# Patient Record
Sex: Male | Born: 1950 | Race: Black or African American | Hispanic: No | Marital: Married | Smoking: Never smoker
Health system: Southern US, Community
[De-identification: ages and names within clinical notes are randomized; demographics above are authoritative.]

## PROBLEM LIST (undated history)

## (undated) DIAGNOSIS — I10 Essential (primary) hypertension: Secondary | ICD-10-CM

## (undated) HISTORY — DX: Essential (primary) hypertension: I10

---

## 2003-07-25 ENCOUNTER — Emergency Department (HOSPITAL_COMMUNITY): Admission: EM | Admit: 2003-07-25 | Discharge: 2003-07-25 | Payer: Self-pay | Admitting: Emergency Medicine

## 2003-09-10 HISTORY — PX: COLONOSCOPY: SHX174

## 2007-05-04 ENCOUNTER — Encounter: Admission: RE | Admit: 2007-05-04 | Discharge: 2007-05-04 | Payer: Self-pay | Admitting: Internal Medicine

## 2007-10-23 ENCOUNTER — Ambulatory Visit (HOSPITAL_BASED_OUTPATIENT_CLINIC_OR_DEPARTMENT_OTHER): Admission: RE | Admit: 2007-10-23 | Discharge: 2007-10-23 | Payer: Self-pay | Admitting: Urology

## 2008-01-22 ENCOUNTER — Ambulatory Visit (HOSPITAL_BASED_OUTPATIENT_CLINIC_OR_DEPARTMENT_OTHER): Admission: RE | Admit: 2008-01-22 | Discharge: 2008-01-22 | Payer: Self-pay | Admitting: Urology

## 2009-04-24 ENCOUNTER — Ambulatory Visit (HOSPITAL_BASED_OUTPATIENT_CLINIC_OR_DEPARTMENT_OTHER): Admission: RE | Admit: 2009-04-24 | Discharge: 2009-04-24 | Payer: Self-pay | Admitting: Orthopedic Surgery

## 2009-05-05 IMAGING — US US SCROTUM
1 series · 14 of 25 positions shown · non-contrast
Comparison: None.

SCROTAL ULTRASOUND WITH DOPPLER EVALUATION OF THE TESTICLES:

CLINICAL DATA: Hydrocele. Pain and scrotal area.
TECHNIQUE: Complete ultrasound examination of the testicles, epididymis, and
other scrotal structures was performed.  Color and duplex Doppler ultrasound was
utilized to evaluate blood flow to the testicles.

[Series 1: us scrotum · 0.10mm/px · 14 of 78 slices shown]
[im 1/78]
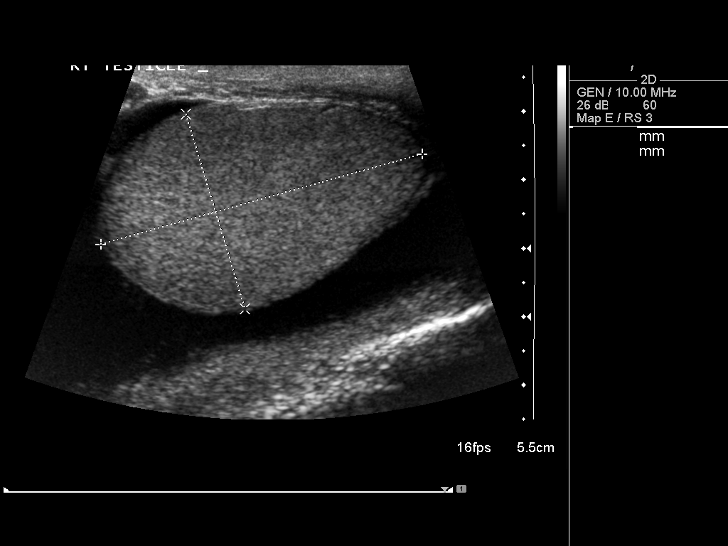
[im 7/78]
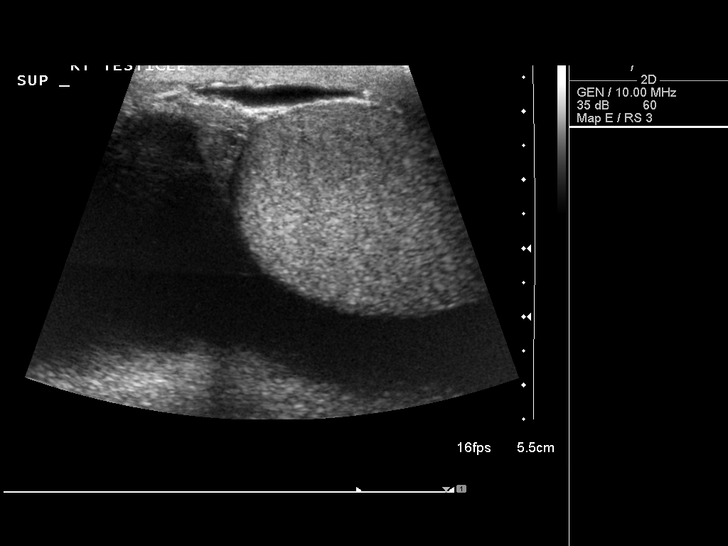
[im 13/78]
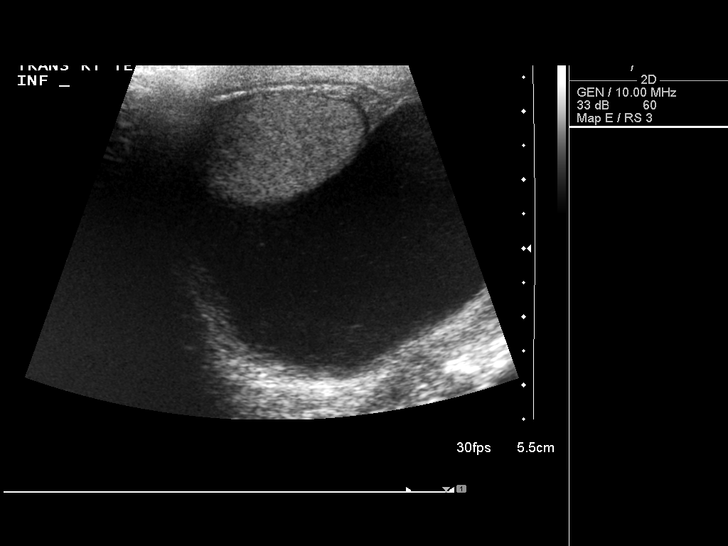
[im 20/78]
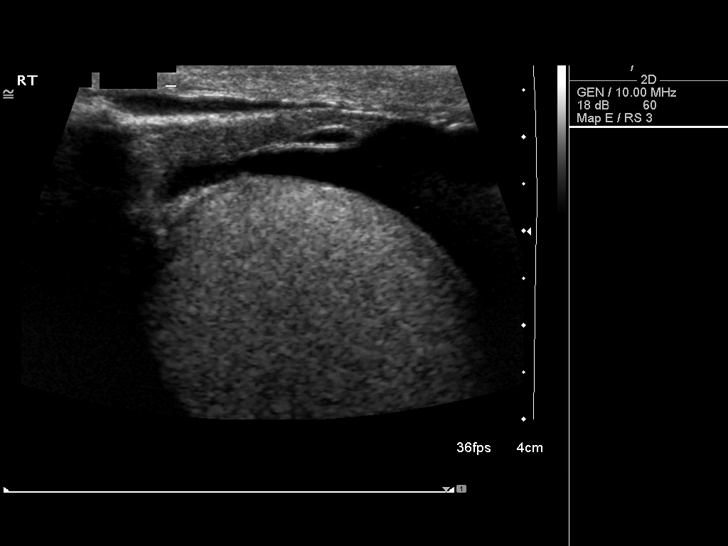
[im 26/78]
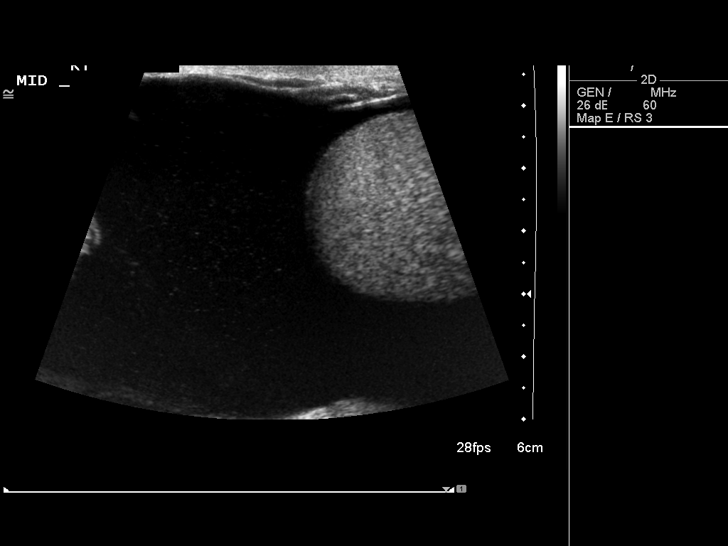
[im 29/78]
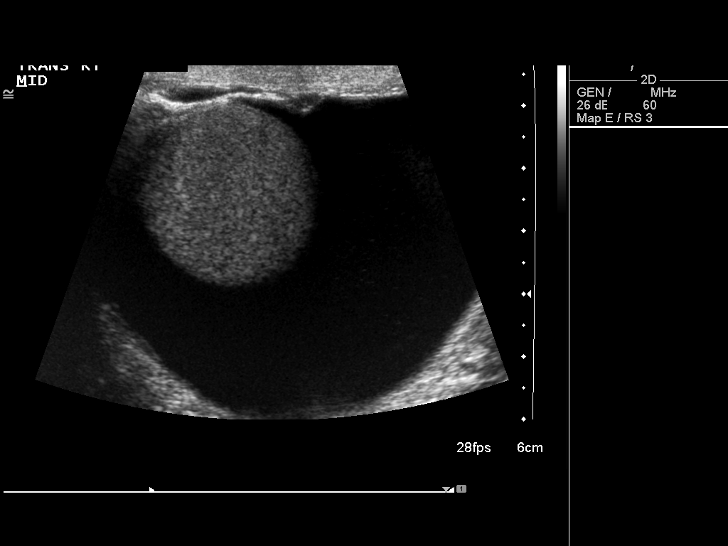
[im 36/78]
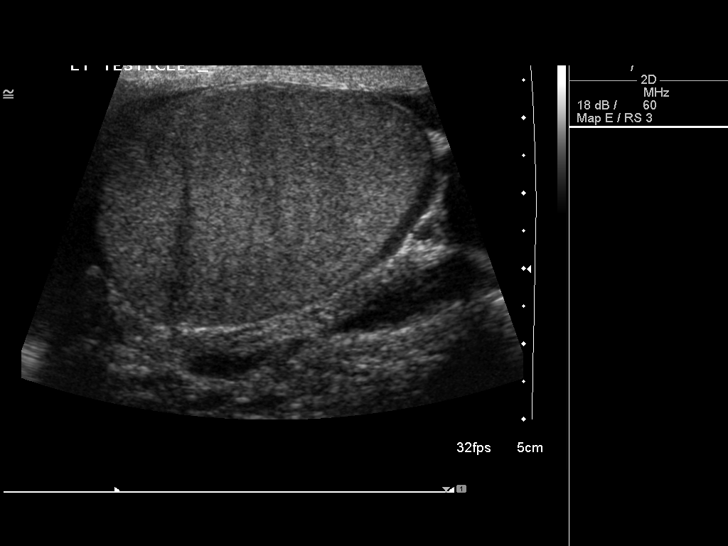
[im 42/78]
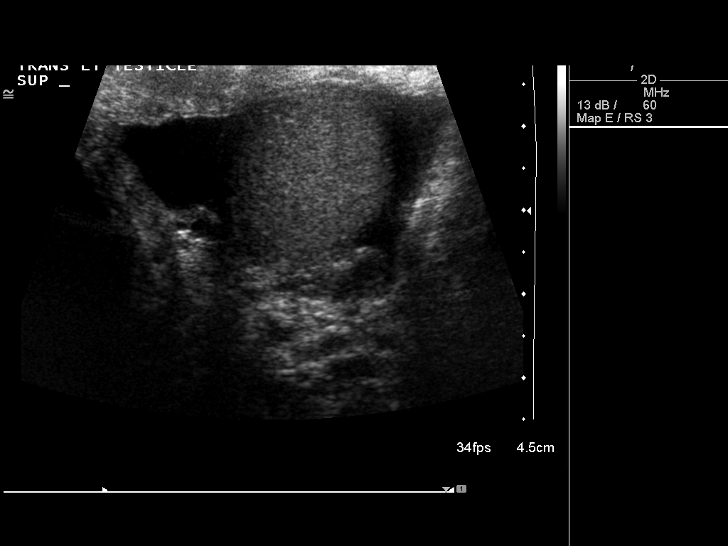
[im 49/78]
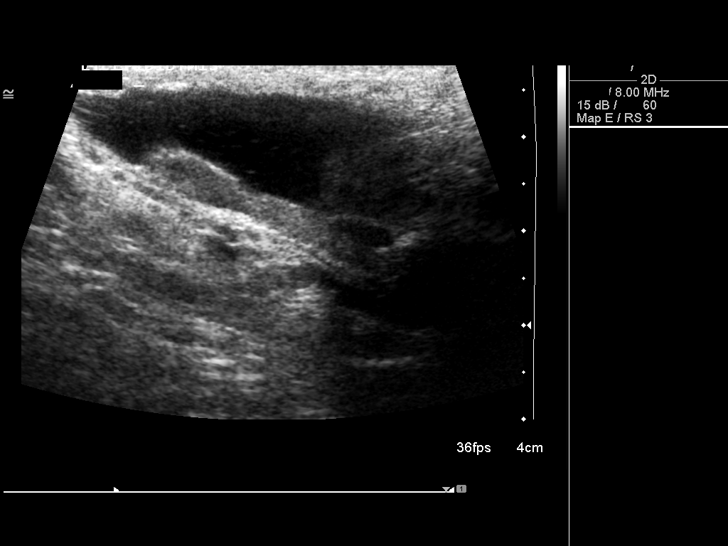
[im 52/78]
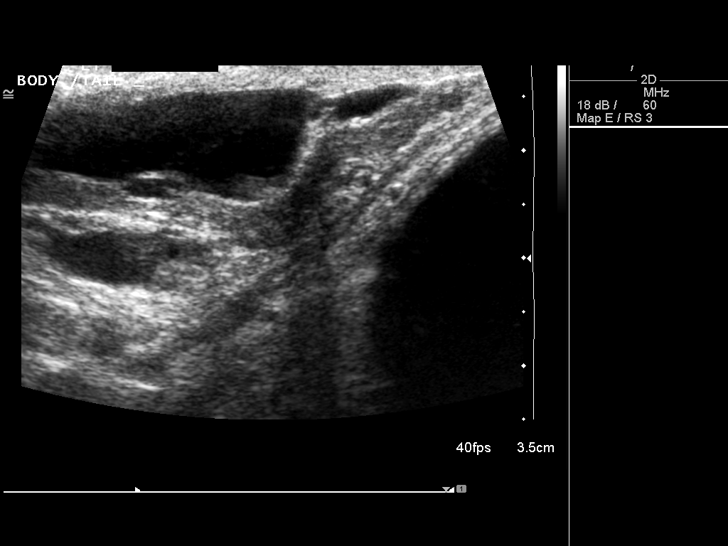
[im 58/78]
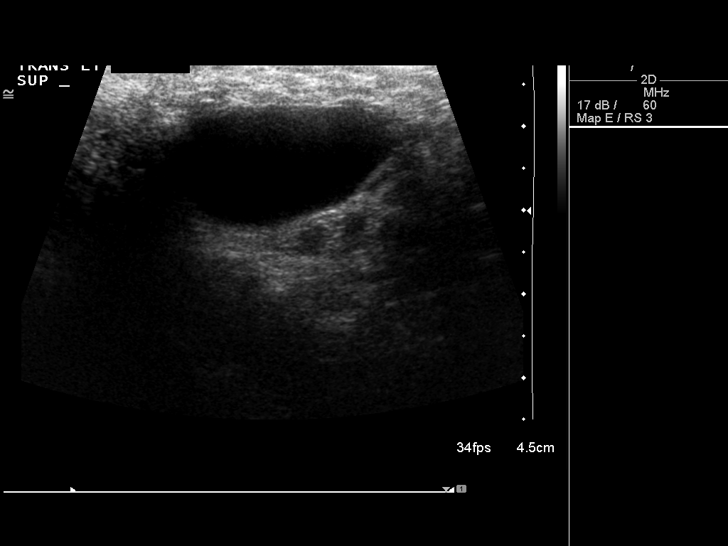
[im 65/78]
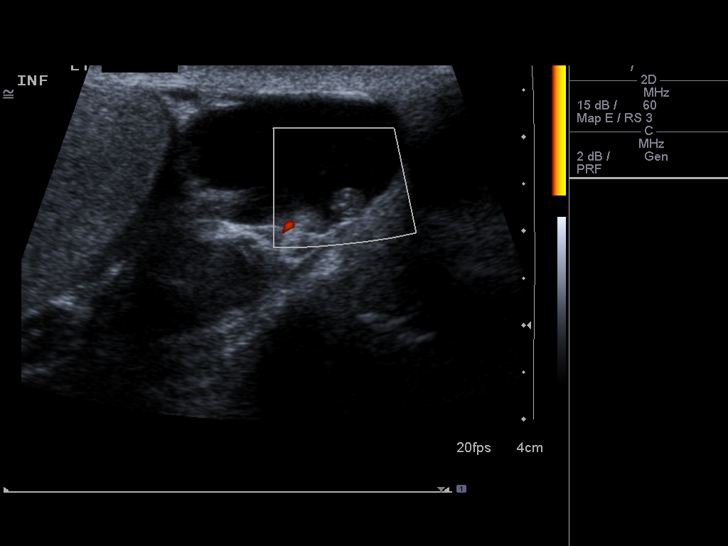
[im 71/78]
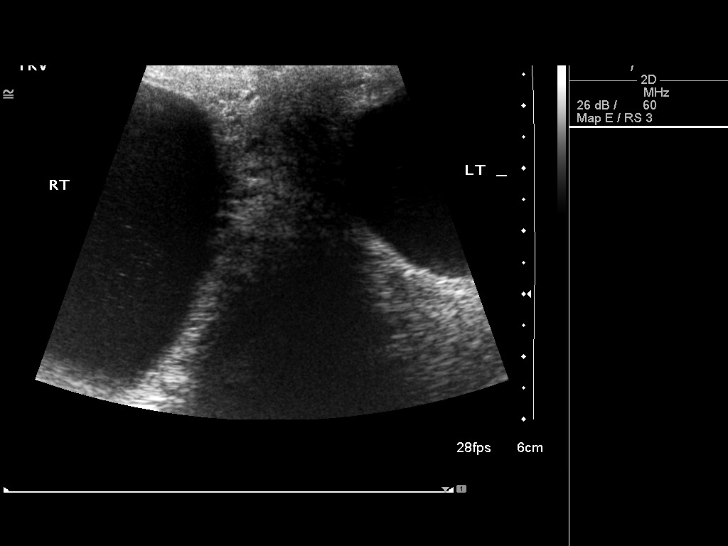
[im 78/78]
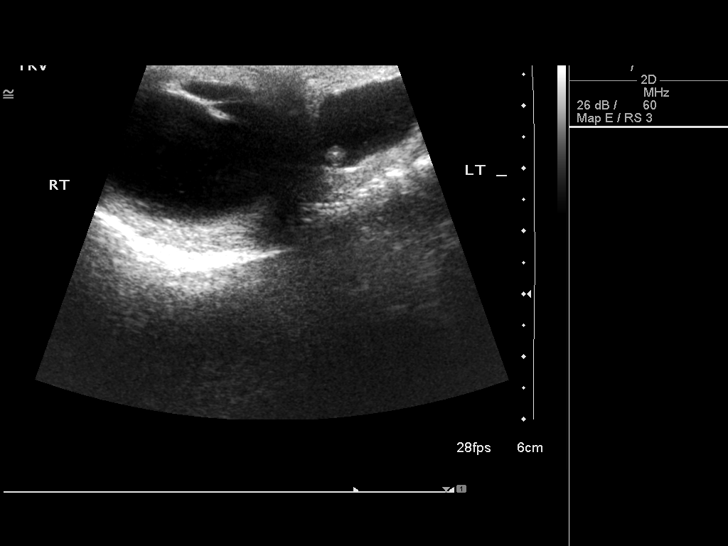

[14 of 25 positions shown; findings below may reference images not displayed]

FINDINGS: The right testicle measures 4.9 x 3.0 x 3.5 cm. The left testicle
measures 4.6 x 3.2 x 3.1 cm. Testicular echotexture is homogeneous. Blood flow
to the testes is symmetric. Arterial and venous waveforms are easily obtainable
within the parenchyma of each testicle.

The epididymal tissues are normal bilaterally. The patient has bilateral
hydroceles, right greater than left. Both hydroceles are complex with the left
showing more internal septations and debris.
IMPRESSION: Bilateral moderate to large hydroceles demonstrating complexity bilaterally. The
left hydrocele demonstrates more internal thickened septation than the right
although there is layering, mobile dependent debris bilaterally.

No intratesticular abnormality. Testicular blood flow is symmetric.

## 2009-05-05 IMAGING — CR DG LUMBAR SPINE COMPLETE 4+V
5 series · 5 of 5 positions shown · non-contrast
Comparison: None.

CLINICAL DATA: Right lower back pain.
 LUMBAR SPINE - 4 VIEW:

[view not recorded (1 of 5)]
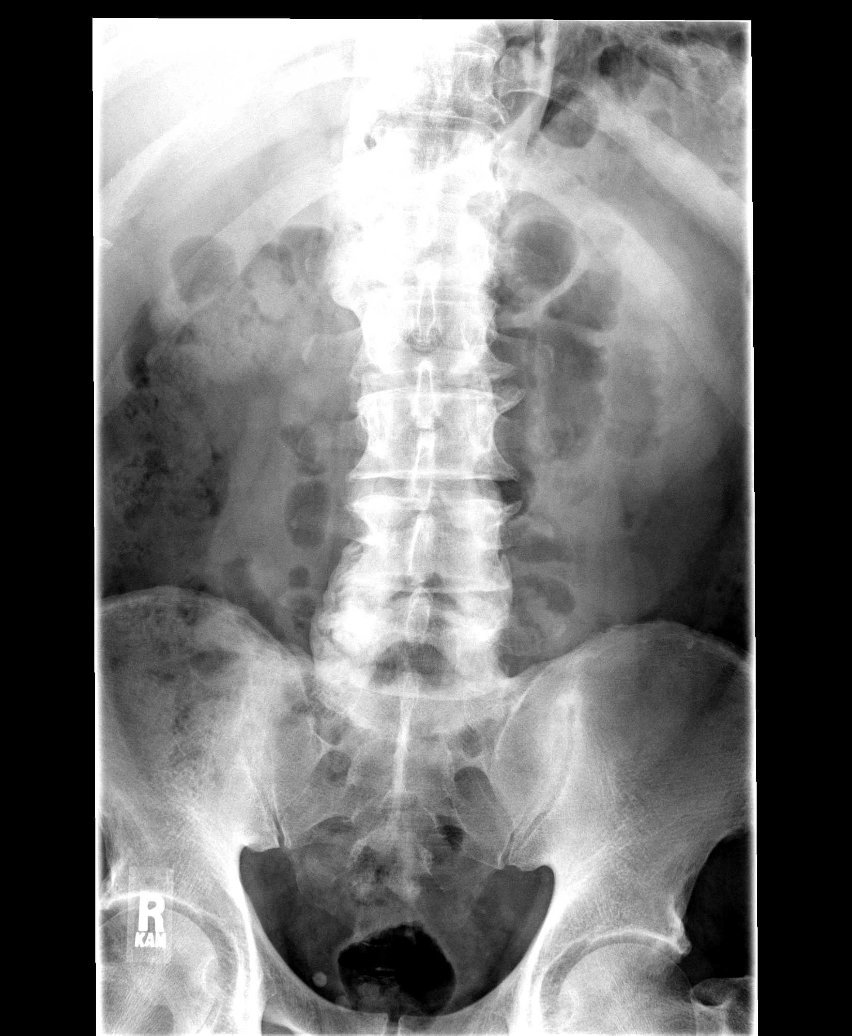

[view not recorded (2 of 5)]
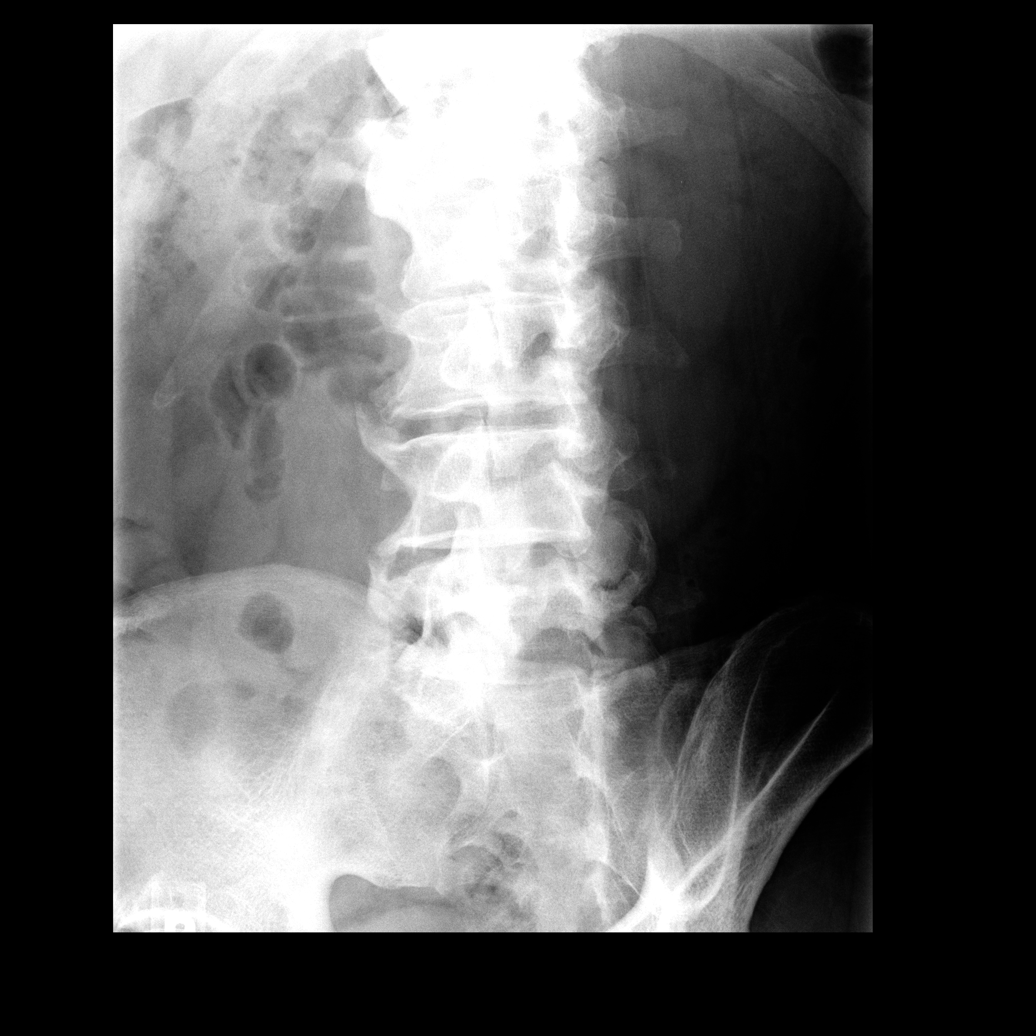

[view not recorded (3 of 5)]
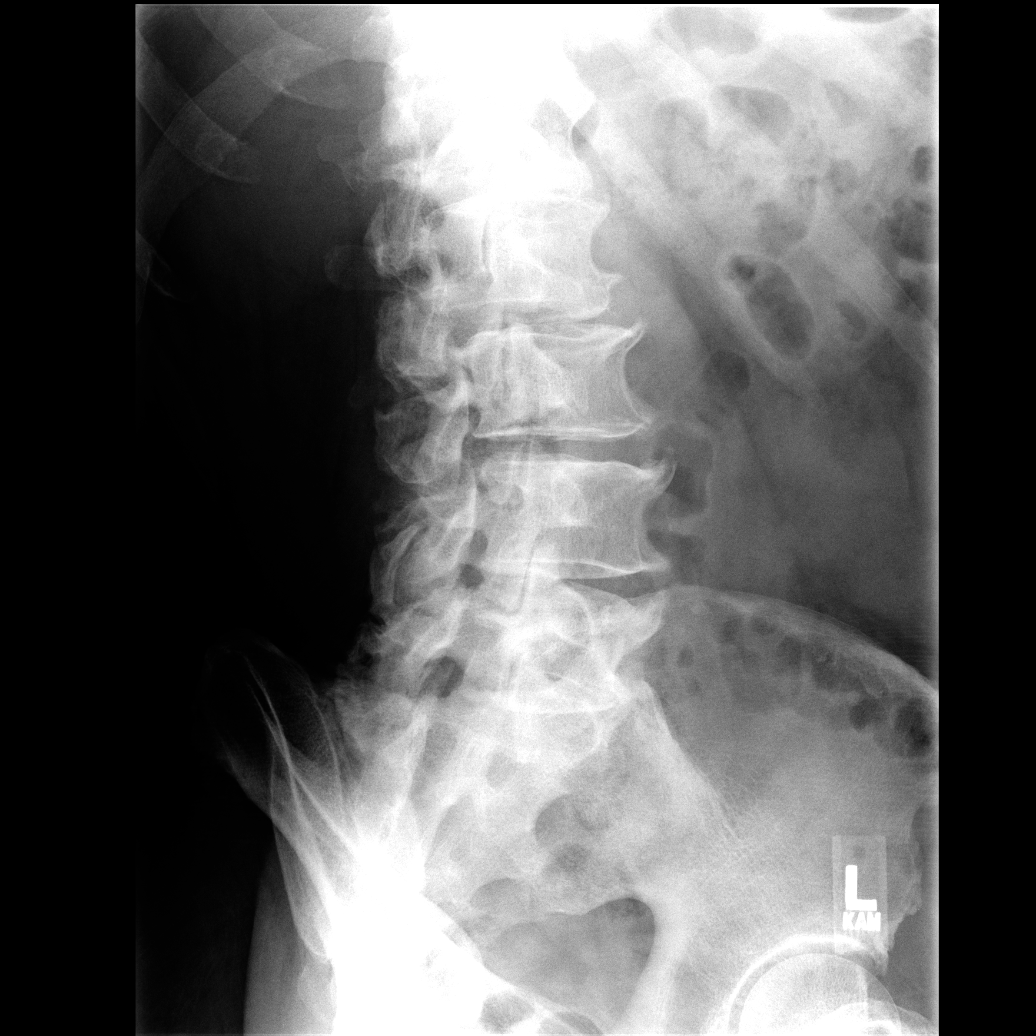

[view not recorded (4 of 5)]
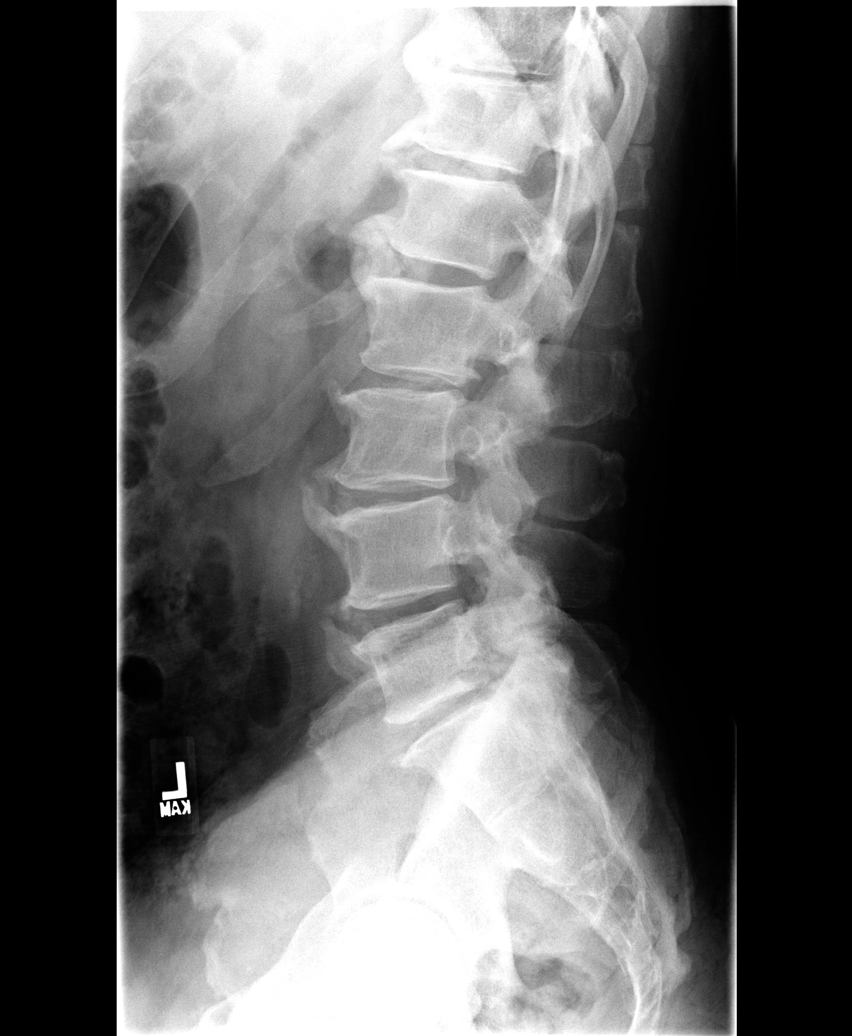

[view not recorded (5 of 5)]
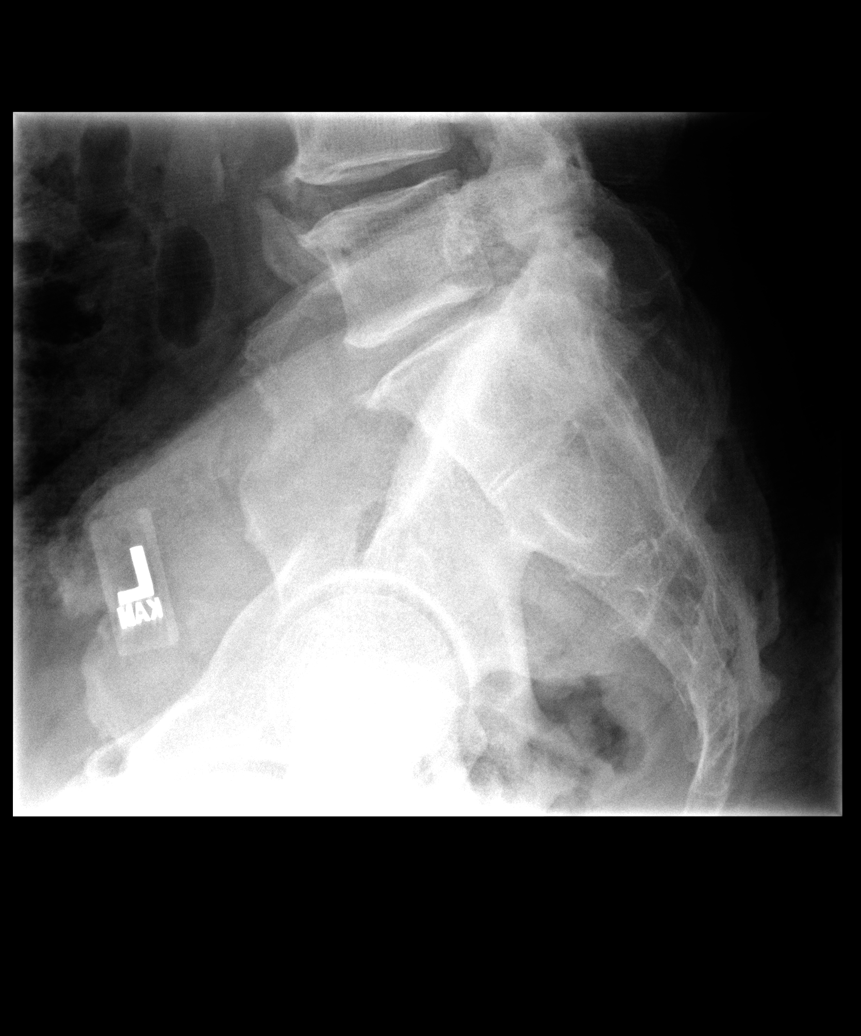

[5 of 5 positions shown; findings below may reference images not displayed]

FINDINGS: Alignment is anatomic.  Vertebral body height is maintained.  There are endplate degenerative changes, marginal osteophytosis, and facet hypertrophy throughout the visualized lumbar spine.  Disk space height is relatively well-maintained.
IMPRESSION: Spondylosis without subluxation or fracture.

## 2010-12-13 ENCOUNTER — Encounter: Payer: Self-pay | Admitting: Gastroenterology

## 2010-12-15 LAB — POCT HEMOGLOBIN-HEMACUE: Hemoglobin: 17 g/dL (ref 13.0–17.0)

## 2011-01-22 NOTE — Op Note (Signed)
NAMEHONG, MORING             ACCOUNT NO.:  [id number]   MEDICAL RECORD NO.:  [id number]          PATIENT TYPE:  AMB   LOCATION:  [facility]                          FACILITY:  [facility]   PHYSICIAN:  Feliberto Gottron. Turner Daniels, M.D.   DATE OF BIRTH:  16-May-1951   DATE OF PROCEDURE:  04/24/2009  DATE OF DISCHARGE:                               OPERATIVE REPORT   PREOPERATIVE DIAGNOSIS:  Symptomatic right elbow olecranon spur with  fracture.   POSTOPERATIVE DIAGNOSIS:  Symptomatic right elbow olecranon spur with  fracture.   PROCEDURE:  Excision of right olecranon spur with fracture.   SURGEON:  Feliberto Gottron. Turner Daniels, MD   FIRST ASSISTANT:  Shirl Harris, PA   ANESTHETIC:  General LMA.   ESTIMATED BLOOD LOSS:  Minimal.   FLUID REPLACEMENT:  800 mL of crystalloid.   DRAINS PLACED:  None.   TOURNIQUET TIME:  None.   INDICATIONS FOR PROCEDURE:  A 60 year old man who has had olecranon spur  removed in the past around 10 or 15 years ago and came into my office a  few weeks ago with a large symptomatic recurrent exostosis off of his  right olecranon with a fracture through the exostosis.  He desires  elective removal.  Risks and benefits of surgery have been discussed.  Questions answered.   DESCRIPTION OF PROCEDURE:  The patient was identified by armband and  taken to the operating room at [facility].  The appropriate  anesthetic monitors were attached and general LMA anesthesia was  induced.  Tourniquet was applied high to the right upper extremity, but  never used.  The right upper extremity was prepped and draped in usual  sterile fashion from the wrist to the mid arm.  Time-out procedure was  then performed.  Then, we began the operation itself by making a  posterior midline incision utilizing the old incision over the olecranon  about 6 or 7 cm in length through the skin and subcutaneous tissue down  to the level of periosteum over the spur.  The spur was then outlined.  It  was quite large, almost 2 cm wide, 2 cm long, and about 8 mm in depth  at its longest.  The major portion of the spur was then removed with an  ACL power saw creating a flatter type 1 acromion and we probably ended  up sacrificing about 10-20% of the triceps insertion, but it was already  compromised and filled with ossicles anyway.  Satisfied with removal of  spur.  We then smoothed off the edges with the ACL saw in a power rasp  mode debrided small ossicles in the triceps tendon, irrigated out with  normal saline solution.  I then closed the  bursa with running 3-0 Vicryl suture, the subcutaneous tissue with 3-0  Vicryl suture, and the skin with 3-0 Vicryl subcuticular suture.  Dressing of Xeroform, 4x4 dressing, sponges, Webril, and an Ace wrap  applied.  The patient was then placed in a sling, awakened, and taken to  the recovery room without difficulty.      Feliberto Gottron. Turner Daniels, M.D.  Electronically  Signed     FJR/MEDQ  D:  04/24/2009  T:  04/25/2009  Job:  [id number]

## 2011-01-22 NOTE — Op Note (Signed)
NAMEARTHOR, GORTER             ACCOUNT NO.:  [id number]   MEDICAL RECORD NO.:  [id number]         PATIENT TYPE:  HAMB   LOCATION:                               FACILITY:  [facility]   PHYSICIAN:  Mark C. Vernie Ammons, M.D.  DATE OF BIRTH:  December 15, 1950   DATE OF PROCEDURE:  01/22/2008  DATE OF DISCHARGE:                               OPERATIVE REPORT   PREOPERATIVE DIAGNOSIS:  Hematocele.   POSTOPERATIVE DIAGNOSIS:  Hematocele.   PROCEDURE PERFORMED:  Scrotal exploration, drainage of hematocele.   SURGEON:  Mark C. Vernie Ammons, M.D.   ASSISTANT:  Melina Schools   ANESTHESIA:  General.   DRAINS:  Half inch Penrose.   INDICATIONS FOR PROCEDURE:  Mr. Elk is a 60 year old male who  underwent bilateral hydrocele repair.  He was noted clinically at that  time to have worse than usual hemostasis.  He subsequently developed a  collection of blood within the tunica vaginalis of his right  hemiscrotum.  He presents for surgical drainage.   DESCRIPTION OF PROCEDURE:  The patient was brought to the operating  room.  He was identified by his armband.  Informed consent was verified  and preoperative time-out was performed.  After the successful induction  of general anesthesia, the patient was placed in the supine position.  The scrotum was shaved, prepped and draped in the usual fashion.  A 4-cm  incision was made in the mid to inferior portion of the median raphe.  Blunt and sharp dissection was used to carry this down to the level of  the tunica vaginalis.  The rind surrounding this was very tense.  He  also appeared to have multiple friable vessels for which gaining control  was difficult.  Because of his apparent propensity for bleeding and the  possibility that he may have bleeding diathesis, the decision was made  not to deliver the hematocele, but simply to drain and close it.  The  tunica vaginalis was entered sharply.  Approximately 100 mL of crank oil  appearing fluid drained.  There  was also old organized clot that was  evacuated.  We vigorously irrigated the right hemiscrotum.  We examined  the testis.  It had some adherent clot over it but appeared otherwise  intact.  We then brought a 1/2 inch Penrose drain out the inferior  dependent portion of the right hemiscrotum and sutured it in place with  a nylon.  We then closed the deep layer of tissue with a running  hemostatic locking 2-0 chromic.  The skin was then closed with a running  3-0 chromic.  Collodion was applied.  At this time the procedure was  terminated.  The patient tolerated the procedure well.   DISPOSITION:  To the postanesthesia care unit.  The patient will have a  blood draw to check his complete blood count, platelet count, as well as  PT and PTT to assess for possible bleeding diathesis.  He may require a  full hematologic evaluation.     ______________________________  Cristal Generous Vernie Ammons, M.D.  Electronically Signed  JR/MEDQ  D:  01/22/2008  T:  01/22/2008  Job:  [id number]

## 2011-01-22 NOTE — Op Note (Signed)
NAMEJERAULD, [name]             ACCOUNT NO.:  [id number]   MEDICAL RECORD NO.:  [id number]          PATIENT TYPE:  AMB   LOCATION:  [facility]                         FACILITY:  [facility]   PHYSICIAN:  Mark C. Vernie Ammons, M.D.  DATE OF BIRTH:  [birth year]   DATE OF PROCEDURE:  10/23/2007  DATE OF DISCHARGE:                               OPERATIVE REPORT   PREOPERATIVE DIAGNOSIS:  Bilateral hydroceles (right greater than left).   POSTOPERATIVE DIAGNOSIS:  Bilateral hydroceles (right greater than  left).   PROCEDURE:  Bilateral hydrocelectomy.   SURGEON:  Mark C. Vernie Ammons, M.D.   ANESTHESIA:  General.   SPECIMENS:  None.   BLOOD LOSS:  Minimal.   COMPLICATIONS:  None.   INDICATIONS:  The patient is a 60 year old male who had noted about a  month's worth of right hemiscrotal swelling when I first saw him in  September 2008.  He had undergone a previous ultrasound of this at  [facility] on May 04, 2007, and that study revealed bilateral  moderate-to-large hydroceles with complexity bilateral.  The left  hydrocele had more internal thickened septations then the right, there  was some question of layering of local mobile debris bilaterally.  The  patient does not have any discomfort, but the size of the hydrocele is  significant enough to warrant treatment, and the patient has been  informed of the risks, complications, and alternatives and has elected  to proceed with surgery.   DESCRIPTION OF OPERATION:  After informed consent, the patient was  brought to the major OR and placed on the table, administered general  anesthesia in a supine position.  His genitalia was then sterilely  prepped and draped and an official time out was performed.  A midline  median raphe scrotal incision was then made and carried down through the  scrotal tissue to expose the tunica of the right testicle with  associated but very large hydrocele.  This was cleared of overlying  tissue and incised in  the midline and clear amber fluid was drained.  Inspection of the internal surface revealed no abnormalities and the  testicle was noted to be entirely normal.  The appendix testis was  identified and fulgurated and the excess parietal tunica was then  excised.  The remainder of the parietal tunica was then reapproximated  posterior to the testicle and the cord with running 3-0 chromic suture  with care being taken not to place any undue tension on the cord at the  most proximal extent.  I then replaced the right testicle back into the  right hemiscrotum in its normal anatomic position and turned my  attention to the left hemiscrotum.   On the left side, an identical procedure was performed and on this side,  I found the hydrocele appeared somewhat more loculated. It was moderate  in size and this was drained and treated in an identical fashion.  The  testicle was also replaced in the left hemiscrotum in its normal  anatomic position and I then irrigated the wound and closed the deep  scrotal tissue with running 3-0 chromic suture. The skin  was then  reapproximated with running 3-0 chromic and fluffs, Kerlix and a scrotal  support were then applied.  The patient was awakened and taken to the  recovery room.  Of note, prior to completion of the scrotal skin  closure, I injected 0.25% Marcaine in the subcu tissue.   In the recovery room, ice was applied to the scrotum and the patient is  to be observed until fully recovered.  He will be given a prescription  for Tylox, number 36, and take doxycycline 100 mg b.i.d. for 3 days.  He  was given written instructions upon discharge and will follow up in my  office in approximately four weeks since and sooner as necessary.      Mark C. Vernie Ammons, M.D.  Electronically Signed     MCO/MEDQ  D:  10/23/2007  T:  10/24/2007  Job:  [id number]

## 2011-05-31 LAB — POCT HEMOGLOBIN-HEMACUE
Hemoglobin: 18.1 — ABNORMAL HIGH
Operator id: 268271

## 2011-06-05 LAB — CBC
HCT: 35 — ABNORMAL LOW
Hemoglobin: 11.9 — ABNORMAL LOW
MCHC: 34.1
MCV: 89.5
Platelets: 179
RBC: 3.9 — ABNORMAL LOW
RDW: 12.5
WBC: 4.7

## 2011-06-05 LAB — PROTIME-INR
INR: 1.1
Prothrombin Time: 14.5

## 2011-06-05 LAB — POCT HEMOGLOBIN-HEMACUE
Hemoglobin: 13.6
Operator id: 268271

## 2011-06-05 LAB — APTT: aPTT: 29

## 2011-10-05 ENCOUNTER — Ambulatory Visit (INDEPENDENT_AMBULATORY_CARE_PROVIDER_SITE_OTHER): Payer: BC Managed Care – PPO

## 2011-10-05 DIAGNOSIS — R071 Chest pain on breathing: Secondary | ICD-10-CM

## 2011-10-05 DIAGNOSIS — R05 Cough: Secondary | ICD-10-CM

## 2013-09-23 ENCOUNTER — Encounter: Payer: Self-pay | Admitting: Gastroenterology

## 2014-06-28 ENCOUNTER — Encounter: Payer: Self-pay | Admitting: Gastroenterology

## 2014-09-28 ENCOUNTER — Ambulatory Visit: Payer: Self-pay | Admitting: Neurology

## 2019-08-04 ENCOUNTER — Encounter: Payer: Self-pay | Admitting: Nurse Practitioner

## 2019-08-04 ENCOUNTER — Other Ambulatory Visit: Payer: Self-pay

## 2019-08-04 ENCOUNTER — Ambulatory Visit: Payer: 59 | Admitting: Nurse Practitioner

## 2019-08-04 VITALS — BP 182/86 | HR 68 | Temp 98.4°F | Ht 65.4 in | Wt 208.6 lb

## 2019-08-04 DIAGNOSIS — Z1211 Encounter for screening for malignant neoplasm of colon: Secondary | ICD-10-CM

## 2019-08-04 DIAGNOSIS — Z13228 Encounter for screening for other metabolic disorders: Secondary | ICD-10-CM

## 2019-08-04 DIAGNOSIS — R202 Paresthesia of skin: Secondary | ICD-10-CM

## 2019-08-04 DIAGNOSIS — Z1159 Encounter for screening for other viral diseases: Secondary | ICD-10-CM | POA: Diagnosis not present

## 2019-08-04 DIAGNOSIS — I10 Essential (primary) hypertension: Secondary | ICD-10-CM

## 2019-08-04 MED ORDER — LOSARTAN POTASSIUM 25 MG PO TABS: 25.0000 mg | ORAL_TABLET | Freq: Every day | ORAL | 2 refills | Status: DC

## 2019-08-04 NOTE — Progress Notes (Addendum)
Subjective:     Patient ID: [name] , male    DOB: [birth year] , 68 y.o.   MRN: [id number]   Chief Complaint  Patient presents with  . Establish Care    Tingling in finger tips at night (R) hand    HPI  Here to establish care - he has not seen a PCP in 3-4 years.  Work in Secretary/administrator.  Married.  2 children - healthy.  He thinks he was on blood pressure medications but was not regularly.  Alcohol - 1/3 of cup nightly.  Non-smoker.  No illicit drugs.    PMH - hypertension.    [facility] - father - dementia.  Siblings - healthy.  Tingling to right fingertips, for 3 weeks.  Right handed.  Worse at night when laying down. When he is up walking around.  Denies any problems holding objects.  No injuries in the past.      Past Medical History:  Diagnosis Date  . Hypertension      Family History  Problem Relation Age of Onset  . Dementia Father      Current Outpatient Medications:  .  losartan (COZAAR) 25 MG tablet, Take 1 tablet (25 mg total) by mouth daily., Disp: 30 tablet, Rfl: 2   No Known Allergies   Review of Systems  Constitutional: Negative.   Respiratory: Negative.   Cardiovascular: Negative.  Negative for chest pain, palpitations and leg swelling.  Musculoskeletal:       Tingling first second and third fingertips.  Neurological: Negative for dizziness and headaches.     Today's Vitals   08/04/19 0918  BP: (!) 182/86  Pulse: 68  Temp: 98.4 F (36.9 C)  TempSrc: Oral  Weight: 208 lb 9.6 oz (94.6 kg)  Height: 5' 5.4" (1.661 m)   Body mass index is 34.29 kg/m.   Objective:  Physical Exam Vitals signs reviewed.  Constitutional:      General: He is not in acute distress.    Appearance: Normal appearance. He is obese.  Cardiovascular:     Rate and Rhythm: Normal rate and regular rhythm.     Pulses: Normal pulses.     Heart sounds: Normal heart sounds. No murmur.  Pulmonary:     Effort: Pulmonary effort is normal. No respiratory distress.   Breath sounds: Normal breath sounds.  Musculoskeletal:        General: No swelling.  Skin:    General: Skin is warm and dry.     Capillary Refill: Capillary refill takes less than 2 seconds.  Neurological:     General: No focal deficit present.     Mental Status: He is alert and oriented to person, place, and time.  Psychiatric:        Mood and Affect: Mood normal.        Behavior: Behavior normal.        Thought Content: Thought content normal.        Judgment: Judgment normal.         Assessment And Plan:     1. Essential hypertension  Chronic, he has not been on any medications for several years.  Elevated today, will start him on losartan  Encouraged to avoid high salt foods - CMP14+EGFR - EKG 12-Lead - losartan (COZAAR) 25 MG tablet; Take 1 tablet (25 mg total) by mouth daily.  Dispense: 30 tablet; Refill: 2  2. Encounter for screening for metabolic disorder  - Lipid Profile - Hemoglobin A1c  3.  Tingling sensation  Reports intermittent tingling sensation to his fingertips  Will check for metabolic causes.  EKG done with SR  Nonspecific T abnormality - Vitamin B12 - TSH  4. Encounter for hepatitis C screening test for low risk patient  Will check for Hepatitis C screening due to being born between the years 40-1966 - Hepatitis C antibody  5. Encounter for screening colonoscopy  According to USPTF Colorectal cancer Screening guidelines. Colonoscopy is recommended every 10 years, starting at age 16years.  Will refer to GI for colon cancer screening. - Ambulatory referral to Gastroenterology     [name], FNP    THE PATIENT IS ENCOURAGED TO PRACTICE SOCIAL DISTANCING DUE TO THE COVID-19 PANDEMIC.

## 2019-08-06 LAB — CMP14+EGFR
ALT: 27 IU/L (ref 0–44)
AST: 22 IU/L (ref 0–40)
Albumin/Globulin Ratio: 1.7 (ref 1.2–2.2)
Albumin: 4.2 g/dL (ref 3.8–4.8)
Alkaline Phosphatase: 79 IU/L (ref 39–117)
BUN/Creatinine Ratio: 14 (ref 10–24)
BUN: 14 mg/dL (ref 8–27)
Bilirubin Total: 0.5 mg/dL (ref 0.0–1.2)
CO2: 26 mmol/L (ref 20–29)
Calcium: 8.8 mg/dL (ref 8.6–10.2)
Chloride: 102 mmol/L (ref 96–106)
Creatinine, Ser: 1.01 mg/dL (ref 0.76–1.27)
GFR calc Af Amer: 87 mL/min/{1.73_m2} (ref >59)
GFR calc non Af Amer: 76 mL/min/{1.73_m2} (ref >59)
Globulin, Total: 2.5 g/dL (ref 1.5–4.5)
Glucose: 102 mg/dL — ABNORMAL HIGH (ref 65–99)
Potassium: 4.2 mmol/L (ref 3.5–5.2)
Sodium: 139 mmol/L (ref 134–144)
Total Protein: 6.7 g/dL (ref 6.0–8.5)

## 2019-08-06 LAB — VITAMIN B12: Vitamin B-12: 370 pg/mL (ref 232–1245)

## 2019-08-06 LAB — LIPID PANEL
Chol/HDL Ratio: 4.2 ratio (ref 0.0–5.0)
Cholesterol, Total: 206 mg/dL — ABNORMAL HIGH (ref 100–199)
HDL: 49 mg/dL (ref >39)
LDL Chol Calc (NIH): 136 mg/dL — ABNORMAL HIGH (ref 0–99)
Triglycerides: 117 mg/dL (ref 0–149)
VLDL Cholesterol Cal: 21 mg/dL (ref 5–40)

## 2019-08-06 LAB — HEMOGLOBIN A1C
Est. average glucose Bld gHb Est-mCnc: 91 mg/dL
Hgb A1c MFr Bld: 4.8 % (ref 4.8–5.6)

## 2019-08-06 LAB — TSH: TSH: 1.66 u[IU]/mL (ref 0.450–4.500)

## 2019-08-06 LAB — HEPATITIS C ANTIBODY: Hep C Virus Ab: <0.1 s/co ratio (ref 0.0–0.9)

## 2019-08-09 ENCOUNTER — Encounter: Payer: Self-pay | Admitting: Nurse Practitioner

## 2019-09-01 ENCOUNTER — Encounter: Payer: Self-pay | Admitting: Gastroenterology

## 2019-09-07 ENCOUNTER — Ambulatory Visit: Payer: 59 | Admitting: Nurse Practitioner

## 2019-09-22 ENCOUNTER — Other Ambulatory Visit: Payer: Self-pay

## 2019-09-22 ENCOUNTER — Ambulatory Visit (AMBULATORY_SURGERY_CENTER): Payer: Self-pay

## 2019-09-22 VITALS — Temp 97.8°F | Ht 65.0 in | Wt 210.4 lb

## 2019-09-22 DIAGNOSIS — Z01818 Encounter for other preprocedural examination: Secondary | ICD-10-CM

## 2019-09-22 DIAGNOSIS — Z1211 Encounter for screening for malignant neoplasm of colon: Secondary | ICD-10-CM

## 2019-09-22 MED ORDER — CLENPIQ 10-3.5-12 MG-GM -GM/160ML PO SOLN: 1.0000 | Freq: Once | ORAL | 0 refills | Status: AC

## 2019-09-22 NOTE — Progress Notes (Signed)
No egg or soy allergy known to patient  No issues with past sedation with any surgeries  or procedures, no intubation problems  No diet pills per patient No home 02 use per patient  No blood thinners per patient  Pt denies issues with constipation  No A fib or A flutter   EMMI video sent to pt's e mail   clenpiq sample given. Lot [id number] EXP-11/2019  Due to the COVID-19 pandemic we are asking patients to follow these guidelines. Please only bring one care partner. Please be aware that your care partner may wait in the car in the parking lot or if they feel like they will be too hot to wait in the car, they may wait in the lobby on the 4th floor. All care partners are required to wear a mask the entire time (we do not have any that we can provide them), they need to practice social distancing, and we will do a Covid check for all patient's and care partners when you arrive. Also we will check their temperature and your temperature. If the care partner waits in their car they need to stay in the parking lot the entire time and we will call them on their cell phone when the patient is ready for discharge so they can bring the car to the front of the building. Also all patient's will need to wear a mask into building.

## 2019-10-01 ENCOUNTER — Ambulatory Visit (INDEPENDENT_AMBULATORY_CARE_PROVIDER_SITE_OTHER): Payer: Commercial Managed Care - PPO

## 2019-10-01 ENCOUNTER — Other Ambulatory Visit: Payer: Self-pay | Admitting: Gastroenterology

## 2019-10-01 DIAGNOSIS — Z1159 Encounter for screening for other viral diseases: Secondary | ICD-10-CM

## 2019-10-04 LAB — SARS CORONAVIRUS 2 (TAT 6-24 HRS): SARS Coronavirus 2: NEGATIVE

## 2019-10-06 ENCOUNTER — Other Ambulatory Visit: Payer: Self-pay

## 2019-10-06 ENCOUNTER — Ambulatory Visit (AMBULATORY_SURGERY_CENTER): Payer: Commercial Managed Care - PPO | Admitting: Gastroenterology

## 2019-10-06 ENCOUNTER — Encounter: Payer: Self-pay | Admitting: Gastroenterology

## 2019-10-06 VITALS — BP 180/104 | HR 67 | Temp 97.7°F | Resp 41 | Ht 65.0 in | Wt 210.4 lb

## 2019-10-06 DIAGNOSIS — K5730 Diverticulosis of large intestine without perforation or abscess without bleeding: Secondary | ICD-10-CM

## 2019-10-06 DIAGNOSIS — K635 Polyp of colon: Secondary | ICD-10-CM

## 2019-10-06 DIAGNOSIS — D125 Benign neoplasm of sigmoid colon: Secondary | ICD-10-CM

## 2019-10-06 DIAGNOSIS — Z1211 Encounter for screening for malignant neoplasm of colon: Secondary | ICD-10-CM | POA: Diagnosis not present

## 2019-10-06 HISTORY — PX: COLONOSCOPY: SHX174

## 2019-10-06 MED ORDER — SODIUM CHLORIDE 0.9 % IV SOLN: 500.0000 mL | Freq: Once | INTRAVENOUS | Status: DC

## 2019-10-06 NOTE — Op Note (Signed)
[place] Patient Name: [name] Procedure Date: 10/06/2019 11:51 AM MRN: [id number] Endoscopist: Gerrit Heck , MD Age: 69 Referring MD:  Date of Birth: Sep 24, 1950 Gender: Male Account #: [id number] Procedure:                Colonoscopy Indications:              Screening for colorectal malignant neoplasm (last                            colonoscopy was more than 10 years ago). LAst                            colonoscopy was in 2005, and no polyps noted. He is                            otherwise without any active GI symptoms and no                            knonw family history of colon cancer. Medicines:                Monitored Anesthesia Care Procedure:                Pre-Anesthesia Assessment:                           - Prior to the procedure, a History and Physical                            was performed, and patient medications and                            allergies were reviewed. The patient's tolerance of                            previous anesthesia was also reviewed. The risks                            and benefits of the procedure and the sedation                            options and risks were discussed with the patient.                            All questions were answered, and informed consent                            was obtained. Prior Anticoagulants: The patient has                            taken no previous anticoagulant or antiplatelet                            agents. ASA Grade Assessment: II - A patient with  mild systemic disease. After reviewing the risks                            and benefits, the patient was deemed in                            satisfactory condition to undergo the procedure.                           After obtaining informed consent, the colonoscope                            was passed under direct vision. Throughout the                            procedure, the patient's  blood pressure, pulse, and                            oxygen saturations were monitored continuously. The                            Colonoscope was introduced through the anus and                            advanced to the the cecum, identified by                            appendiceal orifice and ileocecal valve. The                            colonoscopy was performed without difficulty. The                            patient tolerated the procedure well. The quality                            of the bowel preparation was adequate. The                            ileocecal valve, appendiceal orifice, and rectum                            were photographed. Scope In: 12:04:29 PM Scope Out: 12:18:46 PM Scope Withdrawal Time: 0 hours 11 minutes 47 seconds  Total Procedure Duration: 0 hours 14 minutes 17 seconds  Findings:                 The perianal and digital rectal examinations were                            normal.                           A 2 mm polyp was found in the sigmoid colon. The  polyp was sessile. The polyp was removed with a                            cold biopsy forceps. Resection and retrieval were                            complete. Estimated blood loss was minimal.                           Multiple small and large-mouthed diverticula were                            found in the sigmoid colon, descending colon,                            ascending colon and cecum.                           The retroflexed view of the distal rectum and anal                            verge was normal and showed no anal or rectal                            abnormalities. Complications:            No immediate complications. Estimated Blood Loss:     Estimated blood loss was minimal. Impression:               - One 2 mm polyp in the sigmoid colon, removed with                            a cold biopsy forceps. Resected and retrieved.                            - Diverticulosis in the sigmoid colon, in the                            descending colon, in the ascending colon and in the                            cecum.                           - The distal rectum and anal verge are normal on                            retroflexion view. Recommendation:           - Patient has a contact number available for                            emergencies. The signs and symptoms of potential  delayed complications were discussed with the                            patient. Return to normal activities tomorrow.                            Written discharge instructions were provided to the                            patient.                           - Resume previous diet.                           - Continue present medications.                           - Await pathology results.                           - Repeat colonoscopy for surveillance based on                            pathology results. If adenomatous, repeat                            colonoscopy in 7 years. If benign mucosa, given age                            >4, no recall colonoscopy necessary in the absence                            of GI symptoms.                           - Return to GI clinic PRN. Gerrit Heck, MD 10/06/2019 12:23:00 PM

## 2019-10-06 NOTE — Progress Notes (Signed)
Called to room to assist during endoscopic procedure.  Patient ID and intended procedure confirmed with present staff. Received instructions for my participation in the procedure from the performing physician.  

## 2019-10-06 NOTE — Progress Notes (Signed)
Temp LC Vitals KA  Pt's states no medical or surgical changes since previsit or office visit.

## 2019-10-06 NOTE — Progress Notes (Signed)
To PACU, VSS. Report to [email or web address] 

## 2019-10-06 NOTE — Patient Instructions (Signed)
Handouts given on polyps and diverticulosis.   YOU HAD AN ENDOSCOPIC PROCEDURE TODAY AT [facility]:   Refer to the procedure report that was given to you for any specific questions about what was found during the examination.  If the procedure report does not answer your questions, please call your gastroenterologist to clarify.  If you requested that your care partner not be given the details of your procedure findings, then the procedure report has been included in a sealed envelope for you to review at your convenience later.  YOU SHOULD EXPECT: Some feelings of bloating in the abdomen. Passage of more gas than usual.  Walking can help get rid of the air that was put into your GI tract during the procedure and reduce the bloating. If you had a lower endoscopy (such as a colonoscopy or flexible sigmoidoscopy) you may notice spotting of blood in your stool or on the toilet paper. If you underwent a bowel prep for your procedure, you may not have a normal bowel movement for a few days.  Please Note:  You might notice some irritation and congestion in your nose or some drainage.  This is from the oxygen used during your procedure.  There is no need for concern and it should clear up in a day or so.  SYMPTOMS TO REPORT IMMEDIATELY:   Following lower endoscopy (colonoscopy or flexible sigmoidoscopy):  Excessive amounts of blood in the stool  Significant tenderness or worsening of abdominal pains  Swelling of the abdomen that is new, acute  Fever of 100F or higher  For urgent or emergent issues, a gastroenterologist can be reached at any hour by calling [phone].   DIET:  We do recommend a small meal at first, but then you may proceed to your regular diet.  Drink plenty of fluids but you should avoid alcoholic beverages for 24 hours.  ACTIVITY:  You should plan to take it easy for the rest of today and you should NOT DRIVE or use heavy machinery until tomorrow (because of  the sedation medicines used during the test).    FOLLOW UP: Our staff will call the number listed on your records 48-72 hours following your procedure to check on you and address any questions or concerns that you may have regarding the information given to you following your procedure. If we do not reach you, we will leave a message.  We will attempt to reach you two times.  During this call, we will ask if you have developed any symptoms of COVID 19. If you develop any symptoms (ie: fever, flu-like symptoms, shortness of breath, cough etc.) before then, please call [phone].  If you test positive for Covid 19 in the 2 weeks post procedure, please call and report this information to [place].    If any biopsies were taken you will be contacted by phone or by letter within the next 1-3 weeks.  Please call us at [phone] if you have not heard about the biopsies in 3 weeks.    SIGNATURES/CONFIDENTIALITY: You and/or your care partner have signed paperwork which will be entered into your electronic medical record.  These signatures attest to the fact that that the information above on your After Visit Summary has been reviewed and is understood.  Full responsibility of the confidentiality of this discharge information lies with you and/or your care-partner.

## 2019-10-08 ENCOUNTER — Telehealth: Payer: Self-pay

## 2019-10-08 NOTE — Telephone Encounter (Signed)
No answer, left message to call back later today, B.Sevon Rotert RN. 

## 2019-10-08 NOTE — Telephone Encounter (Signed)
No answer, left message to call if having any issues or concerns, B.Chrystal Zeimet RN 

## 2019-10-12 ENCOUNTER — Other Ambulatory Visit: Payer: Self-pay

## 2019-10-12 ENCOUNTER — Encounter: Payer: Self-pay | Admitting: Nurse Practitioner

## 2019-10-12 ENCOUNTER — Ambulatory Visit: Payer: Commercial Managed Care - PPO | Admitting: Nurse Practitioner

## 2019-10-12 VITALS — BP 162/84 | HR 66 | Temp 98.0°F | Ht 65.0 in | Wt 204.2 lb

## 2019-10-12 DIAGNOSIS — I10 Essential (primary) hypertension: Secondary | ICD-10-CM

## 2019-10-12 DIAGNOSIS — Z23 Encounter for immunization: Secondary | ICD-10-CM

## 2019-10-12 DIAGNOSIS — E7800 Pure hypercholesterolemia, unspecified: Secondary | ICD-10-CM

## 2019-10-12 MED ORDER — LOSARTAN POTASSIUM 50 MG PO TABS: 50.0000 mg | ORAL_TABLET | Freq: Every day | ORAL | 1 refills | Status: DC

## 2019-10-12 MED ORDER — LOSARTAN POTASSIUM 25 MG PO TABS: 25.0000 mg | ORAL_TABLET | Freq: Every day | ORAL | 2 refills | Status: DC

## 2019-10-12 MED ORDER — TETANUS-DIPHTH-ACELL PERTUSSIS 5-2.5-18.5 LF-MCG/0.5 IM SUSP
0.5000 mL | Freq: Once | INTRAMUSCULAR | Status: AC
  Administered 2019-10-12: 11:00:00 0.5 mL via INTRAMUSCULAR

## 2019-10-13 LAB — LIPID PANEL
Chol/HDL Ratio: 4 ratio (ref 0.0–5.0)
Cholesterol, Total: 191 mg/dL (ref 100–199)
HDL: 48 mg/dL (ref >39)
LDL Chol Calc (NIH): 122 mg/dL — ABNORMAL HIGH (ref 0–99)
Triglycerides: 117 mg/dL (ref 0–149)
VLDL Cholesterol Cal: 21 mg/dL (ref 5–40)

## 2019-10-13 LAB — BMP8+EGFR
BUN/Creatinine Ratio: 15 (ref 10–24)
BUN: 16 mg/dL (ref 8–27)
CO2: 23 mmol/L (ref 20–29)
Calcium: 9 mg/dL (ref 8.6–10.2)
Chloride: 104 mmol/L (ref 96–106)
Creatinine, Ser: 1.07 mg/dL (ref 0.76–1.27)
GFR calc Af Amer: 81 mL/min/{1.73_m2} (ref >59)
GFR calc non Af Amer: 70 mL/min/{1.73_m2} (ref >59)
Glucose: 89 mg/dL (ref 65–99)
Potassium: 4.2 mmol/L (ref 3.5–5.2)
Sodium: 140 mmol/L (ref 134–144)

## 2020-01-12 NOTE — Progress Notes (Signed)
This visit occurred during the SARS-CoV-2 public health emergency. Safety protocols were in place, including screening questions prior to the visit, additional usage of staff PPE, and extensive cleaning of exam room while observing appropriate contact time as indicated for disinfecting solutions.  Subjective:     Patient ID: [name] , male    DOB: 1951/08/09 , 69 y.o.   MRN: [id number]      Chief Complaint  Patient presents with  . Hypertension    HPI  Hypertension This is a chronic problem. The current episode started more than 1 year ago. The problem has been gradually improving since onset. The problem is uncontrolled. Pertinent negatives include no anxiety or headaches. There are no associated agents to hypertension. Risk factors for coronary artery disease include male gender, obesity and sedentary lifestyle.         Past Medical History:  Diagnosis Date  . Hypertension           Family History  Problem Relation Age of Onset  . Dementia Father   . Colon cancer Neg Hx   . Colon polyps Neg Hx   . Esophageal cancer Neg Hx   . Rectal cancer Neg Hx   . Stomach cancer Neg Hx      Current Outpatient Medications:  .  losartan (COZAAR) 25 MG tablet, Take 1 tablet (25 mg total) by mouth daily., Disp: 30 tablet, Rfl: 2   No Known Allergies   Review of Systems  Constitutional: Negative.   Respiratory: Negative.  Negative for cough.   Cardiovascular: Negative.   Neurological: Negative.   Psychiatric/Behavioral: Negative.          Today's Vitals   10/12/19 1003  BP: (!) 162/84  Pulse: 66  Temp: 98 F (36.7 C)  TempSrc: Oral  Weight: 204 lb 3.2 oz (92.6 kg)  Height: '5\' 5"'  (1.651 m)   Body mass index is 33.98 kg/m.   Objective:  Physical Exam  Physical Exam  Constitutional: He is oriented to person, place, and time and well-developed, well-nourished, and in no distress. No distress.  Cardiovascular: Normal rate and regular  rhythm.  Pulmonary/Chest: Effort normal and breath sounds normal. No respiratory distress.  Neurological: He is alert and oriented to person, place, and time.  Psychiatric: Mood, memory, affect and judgment normal.       Assessment And Plan:     There are no diagnoses linked to this encounter.  1. Essential hypertension  Chronic, blood pressure is elevated today  Advised to limit intake of high salt foods. - losartan (COZAAR) 50 MG tablet; Take 1 tablet (50 mg total) by mouth daily.  Dispense: 90 tablet; Refill: 1 - BMP8+eGFR  2. Elevated cholesterol  Chronic, stable.   Continue with avoiding fried and fatty foods. - Lipid panel  3. Encounter for immunization  Will give tetanus vaccine today while in office. Refer to order management. TDAP will be administered to adults 75-54 years old every 10 years. - Tdap (BOOSTRIX) injection 0.5 mL   [name], FNP    THE PATIENT IS ENCOURAGED TO PRACTICE SOCIAL DISTANCING DUE TO THE COVID-19 PANDEMIC.

## 2020-01-13 ENCOUNTER — Ambulatory Visit: Payer: Commercial Managed Care - PPO | Admitting: Nurse Practitioner

## 2020-01-13 ENCOUNTER — Encounter: Payer: Self-pay | Admitting: Nurse Practitioner

## 2020-01-13 ENCOUNTER — Other Ambulatory Visit: Payer: Self-pay

## 2020-01-13 VITALS — BP 132/80 | HR 69 | Temp 98.3°F | Ht 60.0 in | Wt 208.2 lb

## 2020-01-13 DIAGNOSIS — R200 Anesthesia of skin: Secondary | ICD-10-CM

## 2020-01-13 DIAGNOSIS — R202 Paresthesia of skin: Secondary | ICD-10-CM | POA: Diagnosis not present

## 2020-01-13 DIAGNOSIS — I10 Essential (primary) hypertension: Secondary | ICD-10-CM | POA: Diagnosis not present

## 2020-01-13 NOTE — Progress Notes (Signed)
This visit occurred during the SARS-CoV-2 public health emergency.  Safety protocols were in place, including screening questions prior to the visit, additional usage of staff PPE, and extensive cleaning of exam room while observing appropriate contact time as indicated for disinfecting solutions.  Subjective:     Patient ID: [name] , male    DOB: 04/06/1951 , 69 y.o.   MRN: [id number]   Chief Complaint  Patient presents with  . Hypertension    HPI  Hypertension This is a chronic problem. The current episode started more than 1 year ago. The problem is unchanged. The problem is controlled. Pertinent negatives include no anxiety, chest pain, headaches, palpitations or shortness of breath. Risk factors for coronary artery disease include obesity and sedentary lifestyle. Past treatments include angiotensin blockers. The current treatment provides moderate improvement. There are no compliance problems.  There is no history of angina. There is no history of chronic renal disease.     Past Medical History:  Diagnosis Date  . Hypertension      Family History  Problem Relation Age of Onset  . Dementia Father   . Colon cancer Neg Hx   . Colon polyps Neg Hx   . Esophageal cancer Neg Hx   . Rectal cancer Neg Hx   . Stomach cancer Neg Hx      Current Outpatient Medications:  .  losartan (COZAAR) 50 MG tablet, Take 1 tablet (50 mg total) by mouth daily., Disp: 90 tablet, Rfl: 1   No Known Allergies   Review of Systems  Constitutional: Negative.   Respiratory: Negative for shortness of breath.   Cardiovascular: Negative.  Negative for chest pain, palpitations and leg swelling.  Neurological: Negative for dizziness and headaches.  Psychiatric/Behavioral: Negative.      Today's Vitals   01/13/20 0921  BP: 132/80  Pulse: 69  Temp: 98.3 F (36.8 C)  TempSrc: Oral  SpO2: 96%  Weight: 208 lb 3.2 oz (94.4 kg)  Height: 5' (1.524 m)   Body mass index is 40.66 kg/m.    Objective:  Physical Exam Constitutional:      General: He is not in acute distress.    Appearance: Normal appearance.  Cardiovascular:     Rate and Rhythm: Normal rate and regular rhythm.     Pulses: Normal pulses.     Heart sounds: Normal heart sounds. No murmur.  Pulmonary:     Effort: Pulmonary effort is normal. No respiratory distress.     Breath sounds: Normal breath sounds.  Skin:    Capillary Refill: Capillary refill takes less than 2 seconds.  Neurological:     General: No focal deficit present.     Mental Status: He is alert and oriented to person, place, and time.     Cranial Nerves: No cranial nerve deficit.  Psychiatric:        Mood and Affect: Mood normal.        Behavior: Behavior normal.        Thought Content: Thought content normal.        Judgment: Judgment normal.         Assessment And Plan:     1. Essential hypertension  Chronic, much better controlled  Continue with current medications  2. Tingling sensation  Persistent will refer to hand specialist  3. Numbness and tingling in right hand  Encouraged to wear a wrist splint at night and will refer to hand specialist - Ambulatory referral to [place]  Laurance Flatten, FNP    THE PATIENT IS ENCOURAGED TO PRACTICE SOCIAL DISTANCING DUE TO THE COVID-19 PANDEMIC.

## 2020-04-08 ENCOUNTER — Other Ambulatory Visit: Payer: Self-pay | Admitting: Nurse Practitioner

## 2020-04-08 DIAGNOSIS — I10 Essential (primary) hypertension: Secondary | ICD-10-CM

## 2020-04-17 ENCOUNTER — Ambulatory Visit: Payer: Commercial Managed Care - PPO | Admitting: Nurse Practitioner

## 2020-04-17 ENCOUNTER — Encounter: Payer: Self-pay | Admitting: Nurse Practitioner

## 2020-04-17 ENCOUNTER — Other Ambulatory Visit: Payer: Self-pay

## 2020-04-17 VITALS — BP 140/80 | HR 60 | Temp 98.2°F | Ht 64.8 in | Wt 206.4 lb

## 2020-04-17 DIAGNOSIS — E7800 Pure hypercholesterolemia, unspecified: Secondary | ICD-10-CM

## 2020-04-17 DIAGNOSIS — R200 Anesthesia of skin: Secondary | ICD-10-CM

## 2020-04-17 DIAGNOSIS — I10 Essential (primary) hypertension: Secondary | ICD-10-CM | POA: Diagnosis not present

## 2020-04-17 DIAGNOSIS — R202 Paresthesia of skin: Secondary | ICD-10-CM | POA: Diagnosis not present

## 2020-04-17 NOTE — Progress Notes (Signed)
This visit occurred during the SARS-CoV-2 public health emergency.  Safety protocols were in place, including screening questions prior to the visit, additional usage of staff PPE, and extensive cleaning of exam room while observing appropriate contact time as indicated for disinfecting solutions.  Subjective:     Patient ID: [name] , male    DOB: August 13, 1951 , 69 y.o.   MRN: [id number]   Chief Complaint  Patient presents with  . Hypertension    HPI  Presents today for 3 month follow up for HTN. He is currently taking Cozaar 50 mg daily. He is doing well with his medications. He is walking  about two hours a week. He reports that he is compliant with his medications and is watching his sodium intake.B/P today is 140/80 and he states that he doesn't check his pressure at home. He is interests in getting a BP cuff to check his numbers at home.  He had to cancel his referral to his hand appointemnt due to jury duty. He states that he didn't get any relief from the splint. He has a negative Phalen's test and a positive Tinel sign.  He had the [place] in July. He is also intrusted in the Flu vaccine when it is offered next month.  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    Hypertension This is a chronic problem. The current episode started more than 1 year ago. The problem is unchanged. The problem is controlled. Pertinent negatives include no chest pain, headaches, palpitations or peripheral edema. There are no associated agents to hypertension. Risk factors for coronary artery disease include male gender and family history. Past treatments include angiotensin blockers and calcium channel blockers. The current treatment provides mild improvement. There are no compliance problems.  There is no history of CAD/MI, CVA or PVD.     Past Medical History:  Diagnosis Date  . Hypertension      Family History  Problem Relation Age of Onset  .  Dementia Father   . Colon cancer Neg Hx   . Colon polyps Neg Hx   . Esophageal cancer Neg Hx   . Rectal cancer Neg Hx   . Stomach cancer Neg Hx      Current Outpatient Medications:  .  losartan (COZAAR) 50 MG tablet, TAKE 1 TABLET BY MOUTH EVERY DAY, Disp: 90 tablet, Rfl: 1   No Known Allergies   Review of Systems  Constitutional: Negative.   HENT: Negative.   Eyes: Negative.   Respiratory: Negative.   Cardiovascular: Negative.  Negative for chest pain, palpitations and leg swelling.  Gastrointestinal: Negative.   Endocrine: Negative.   Genitourinary: Negative.   Musculoskeletal: Negative.   Skin: Negative.   Neurological: Positive for numbness. Negative for headaches.       Right hand numbness  Hematological: Negative.   Psychiatric/Behavioral: Negative.      Today's Vitals   04/17/20 0843  BP: 140/80  Pulse: 60  Temp: 98.2 F (36.8 C)  TempSrc: Oral  Weight: 206 lb 6.4 oz (93.6 kg)  Height: 5' 4.8" (1.646 m)  PainSc: 0-No pain   Body mass index is 34.56 kg/m.   Objective:  Physical Exam Constitutional:      Appearance: Normal appearance. He is normal weight.  Neck:     Vascular: No carotid bruit.  Cardiovascular:     Rate and Rhythm: Normal rate and regular rhythm.     Pulses: Normal pulses.  Pulmonary:     Effort: Pulmonary effort is normal.     Breath sounds: Normal breath sounds. No wheezing.  Musculoskeletal:        General: Normal range of motion.     Cervical back: Normal range of motion.  Skin:    General: Skin is warm.  Neurological:     General: No focal deficit present.     Mental Status: He is alert and oriented to person, place, and time. Mental status is at baseline.     Cranial Nerves: No cranial nerve deficit.     Sensory: Sensory deficit present.     Comments: Positive Tinel sign on right hand and wrist.  Psychiatric:        Mood and Affect: Mood normal.        Behavior: Behavior normal.        Thought Content: Thought content  normal.        Judgment: Judgment normal.         Assessment And Plan:     1. Essential hypertension . B/P is slightly elevated today but controlled.  Marland Kitchen BMP ordered to check renal function.  . The importance of regular exercise and dietary modification was stressed to the patient.  - BMP8+eGFR  2. Elevated cholesterol  Chronic, controlled  Diet controlled, will check lipid panel - Lipid panel  3. Numbness and tingling in right hand   he was unable to make the appt he had scheduled due to having Jury duty, I have given him the number to the orthopedic for him to call and reschedule.  Positive tinel's  Will add him to the list for the flu vaccine. He is to call back with the dates for his covid vaccine.   Patient was given opportunity to ask questions. Patient verbalized understanding of the plan and was able to repeat key elements of the plan. All questions were answered to their satisfaction.  [name], FNP   I, [name], FNP, have reviewed all documentation for this visit. The documentation on 04/17/20 for the exam, diagnosis, procedures, and orders are all accurate and complete.  THE PATIENT IS ENCOURAGED TO PRACTICE SOCIAL DISTANCING DUE TO THE COVID-19 PANDEMIC.

## 2020-04-18 LAB — BMP8+EGFR
BUN/Creatinine Ratio: 15 (ref 10–24)
BUN: 15 mg/dL (ref 8–27)
CO2: 25 mmol/L (ref 20–29)
Calcium: 8.8 mg/dL (ref 8.6–10.2)
Chloride: 101 mmol/L (ref 96–106)
Creatinine, Ser: 1.01 mg/dL (ref 0.76–1.27)
GFR calc Af Amer: 87 mL/min/{1.73_m2} (ref >59)
GFR calc non Af Amer: 76 mL/min/{1.73_m2} (ref >59)
Glucose: 96 mg/dL (ref 65–99)
Potassium: 4.5 mmol/L (ref 3.5–5.2)
Sodium: 139 mmol/L (ref 134–144)

## 2020-04-18 LAB — LIPID PANEL
Chol/HDL Ratio: 4.9 ratio (ref 0.0–5.0)
Cholesterol, Total: 209 mg/dL — ABNORMAL HIGH (ref 100–199)
HDL: 43 mg/dL (ref >39)
LDL Chol Calc (NIH): 134 mg/dL — ABNORMAL HIGH (ref 0–99)
Triglycerides: 177 mg/dL — ABNORMAL HIGH (ref 0–149)
VLDL Cholesterol Cal: 32 mg/dL (ref 5–40)

## 2020-04-19 ENCOUNTER — Encounter: Payer: Self-pay | Admitting: Nurse Practitioner

## 2020-08-16 ENCOUNTER — Other Ambulatory Visit: Payer: Self-pay

## 2020-08-16 ENCOUNTER — Ambulatory Visit (INDEPENDENT_AMBULATORY_CARE_PROVIDER_SITE_OTHER): Payer: Commercial Managed Care - PPO

## 2020-08-16 VITALS — BP 160/90 | HR 84 | Temp 98.6°F | Ht 64.8 in | Wt 206.4 lb

## 2020-08-16 DIAGNOSIS — Z23 Encounter for immunization: Secondary | ICD-10-CM

## 2020-08-16 NOTE — Progress Notes (Signed)
Patient here for a flu vaccine.

## 2020-08-21 ENCOUNTER — Other Ambulatory Visit: Payer: Self-pay

## 2020-08-21 ENCOUNTER — Encounter: Payer: Self-pay | Admitting: Nurse Practitioner

## 2020-08-21 ENCOUNTER — Ambulatory Visit: Payer: Commercial Managed Care - PPO | Admitting: Nurse Practitioner

## 2020-08-21 VITALS — BP 144/80 | HR 79 | Temp 98.3°F | Ht 64.2 in | Wt 221.0 lb

## 2020-08-21 DIAGNOSIS — I10 Essential (primary) hypertension: Secondary | ICD-10-CM | POA: Diagnosis not present

## 2020-08-21 DIAGNOSIS — Z0000 Encounter for general adult medical examination without abnormal findings: Secondary | ICD-10-CM

## 2020-08-21 DIAGNOSIS — Z125 Encounter for screening for malignant neoplasm of prostate: Secondary | ICD-10-CM

## 2020-08-21 DIAGNOSIS — E7800 Pure hypercholesterolemia, unspecified: Secondary | ICD-10-CM | POA: Diagnosis not present

## 2020-08-21 DIAGNOSIS — E782 Mixed hyperlipidemia: Secondary | ICD-10-CM

## 2020-08-21 DIAGNOSIS — H6121 Impacted cerumen, right ear: Secondary | ICD-10-CM | POA: Diagnosis not present

## 2020-08-21 DIAGNOSIS — Z6837 Body mass index (BMI) 37.0-37.9, adult: Secondary | ICD-10-CM

## 2020-08-21 DIAGNOSIS — E669 Obesity, unspecified: Secondary | ICD-10-CM

## 2020-08-21 LAB — POCT URINALYSIS DIPSTICK
Bilirubin, UA: NEGATIVE
Glucose, UA: NEGATIVE
Ketones, UA: NEGATIVE
Leukocytes, UA: NEGATIVE
Nitrite, UA: NEGATIVE
Protein, UA: POSITIVE — AB
Spec Grav, UA: 1.025 (ref 1.010–1.025)
Urobilinogen, UA: 0.2 E.U./dL
pH, UA: 5.5 (ref 5.0–8.0)

## 2020-08-21 LAB — POCT UA - MICROALBUMIN
Creatinine, POC: 300 mg/dL
Microalbumin Ur, POC: 150 mg/L

## 2020-08-21 MED ORDER — LOSARTAN POTASSIUM 100 MG PO TABS: 100.0000 mg | ORAL_TABLET | Freq: Every day | ORAL | 1 refills | Status: DC

## 2020-08-21 NOTE — Progress Notes (Addendum)
I,Yamilka Roman [employer] as a Education administrator for [employer], FNP.,have documented all relevant documentation on the behalf of [name], FNP,as directed by  [name], FNP while in the presence of [name], [place]. This visit occurred during the SARS-CoV-2 public health emergency.  Safety protocols were in place, including screening questions prior to the visit, additional usage of staff PPE, and extensive cleaning of exam room while observing appropriate contact time as indicated for disinfecting solutions.  Subjective:     Patient ID: [name] , male    DOB: 1951/03/04 , 69 y.o.   MRN: [id number]   Chief Complaint  Patient presents with  . Annual Exam    HPI  Here for HM. He is will be on medicare next year. He will get his booster in 2 weeks for Covid   Hypertension This is a chronic problem. The current episode started more than 1 year ago. The problem is unchanged. The problem is uncontrolled. Pertinent negatives include no anxiety, blurred vision or peripheral edema. Risk factors for coronary artery disease include sedentary lifestyle, obesity, male gender and dyslipidemia.     Past Medical History:  Diagnosis Date  . Hypertension      Family History  Problem Relation Age of Onset  . Dementia Father   . Colon cancer Neg Hx   . Colon polyps Neg Hx   . Esophageal cancer Neg Hx   . Rectal cancer Neg Hx   . Stomach cancer Neg Hx      Current Outpatient Medications:  .  losartan (COZAAR) 100 MG tablet, Take 1 tablet (100 mg total) by mouth daily., Disp: 90 tablet, Rfl: 1   No Known Allergies   Men's preventive visit. Patient Health Questionnaire (PHQ-2) is  [place] Office Visit from 08/21/2020 in [facility]  PHQ-2 Total Score 0     Patient is on a regular diet. Exercising by walking, not as much as he should but will change at the end of the month. Marital status: Married. Relevant history for alcohol use is:  Social  History   Substance and Sexual Activity  Alcohol Use Yes   Relevant history for tobacco use is:  Social History   Tobacco Use  Smoking Status Never Smoker  Smokeless Tobacco Never Used  .   Review of Systems  Constitutional: Negative.   HENT: Negative.   Eyes: Negative.  Negative for blurred vision.  Respiratory: Negative.   Cardiovascular: Negative.   Gastrointestinal: Negative.   Endocrine: Negative.   Genitourinary: Negative.   Musculoskeletal: Negative.   Skin: Negative.   Neurological: Negative.   Hematological: Negative.   Psychiatric/Behavioral: Negative.      Today's Vitals   08/21/20 0949  BP: (!) 144/80  Pulse: 79  Temp: 98.3 F (36.8 C)  TempSrc: Oral  Weight: 221 lb (100.2 kg)  Height: 5' 4.2" (1.631 m)  PainSc: 0-No pain   Body mass index is 37.7 kg/m.   Objective:  Physical Exam Vitals reviewed.  Constitutional:      General: He is not in acute distress.    Appearance: Normal appearance. He is obese.  HENT:     Head: Normocephalic and atraumatic.     Right Ear: External ear normal. There is impacted cerumen (hard cerumen present to canal).     Left Ear: Tympanic membrane, ear canal and external ear normal. There is no impacted cerumen.     Nose:     Comments: Deferred - masked  Mouth/Throat:     Comments: Deferred - masked Cardiovascular:     Rate and Rhythm: Normal rate and regular rhythm.     Pulses: Normal pulses.     Heart sounds: Normal heart sounds. No murmur heard.   Pulmonary:     Effort: Pulmonary effort is normal. No respiratory distress.     Breath sounds: Normal breath sounds. No wheezing.  Abdominal:     General: Abdomen is flat. Bowel sounds are normal. There is no distension.     Palpations: Abdomen is soft.  Genitourinary:    Prostate: Normal.     Rectum: Guaiac result negative.  Musculoskeletal:        General: No swelling or tenderness. Normal range of motion.     Cervical back: Normal range of motion and neck  supple.  Skin:    General: Skin is warm.     Capillary Refill: Capillary refill takes less than 2 seconds.  Neurological:     General: No focal deficit present.     Mental Status: He is alert and oriented to person, place, and time.     Cranial Nerves: No cranial nerve deficit.  Psychiatric:        Mood and Affect: Mood normal.        Behavior: Behavior normal.        Thought Content: Thought content normal.        Judgment: Judgment normal.         Assessment And Plan:    1. Encounter for general adult medical examination w/o abnormal findings . Behavior modifications discussed and diet history reviewed.   . Pt will continue to exercise regularly and modify diet with low GI, plant based foods and decrease intake of processed foods.  . Recommend intake of daily multivitamin, Vitamin D, and calcium.  . Recommend colonoscopy for preventive screenings, as well as recommend immunizations that include influenza, TDAP (up to date)  2. Essential hypertension . B/P is slightly elevated today, I am increasing his losartan to 100 mg daily . CMP ordered to check renal function.  . The importance of regular exercise and dietary modification was stressed to the patient.  . Stressed importance of losing ten percent of her body weight to help with B/P control.  . The weight loss would help with decreasing cardiac and cancer risk as well.  . EKG done with SR nonspecific t abnormality, HR 77 - POCT Urinalysis Dipstick (81002) - POCT UA - Microalbumin - EKG 12-Lead - CMP14+EGFR - CBC no Diff - losartan (COZAAR) 100 MG tablet; Take 1 tablet (100 mg total) by mouth daily.  Dispense: 90 tablet; Refill: 1  3. Elevated cholesterol  Chronic, pending labs will add cholesterol medications - Lipid panel  4. Encounter for prostate cancer screening - PSA  5. Obesity (BMI 30-39.9)  Chronic  Discussed healthy diet and regular exercise options   Encouraged to exercise at least 150 minutes per  week with 2 days of strength training - Hemoglobin A1c  6. Impacted cerumen of right ear  Water and peroxide lavage with good results - Ear Lavage   Increased losartan to 100 mg daily Ear lavage done to hard cerumen  Patient was given opportunity to ask questions. Patient verbalized understanding of the plan and was able to repeat key elements of the plan. All questions were answered to their satisfaction.   Jeanell Sparrow, FNP, have reviewed all documentation for this visit. The documentation on 08/24/20 for the exam, diagnosis, procedures,  and orders are all accurate and complete.  THE PATIENT IS ENCOURAGED TO PRACTICE SOCIAL DISTANCING DUE TO THE COVID-19 PANDEMIC.

## 2020-08-21 NOTE — Patient Instructions (Addendum)
Health Maintenance, Male Adopting a healthy lifestyle and getting preventive care are important in promoting health and wellness. Ask your health care provider about:  The right schedule for you to have regular tests and exams.  Things you can do on your own to prevent diseases and keep yourself healthy. What should I know about diet, weight, and exercise? Eat a healthy diet   Eat a diet that includes plenty of vegetables, fruits, low-fat dairy products, and lean protein.  Do not eat a lot of foods that are high in solid fats, added sugars, or sodium. Maintain a healthy weight Body mass index (BMI) is a measurement that can be used to identify possible weight problems. It estimates body fat based on height and weight. Your health care provider can help determine your BMI and help you achieve or maintain a healthy weight. Get regular exercise Get regular exercise. This is one of the most important things you can do for your health. Most adults should:  Exercise for at least 150 minutes each week. The exercise should increase your heart rate and make you sweat (moderate-intensity exercise).  Do strengthening exercises at least twice a week. This is in addition to the moderate-intensity exercise.  Spend less time sitting. Even light physical activity can be beneficial. Watch cholesterol and blood lipids Have your blood tested for lipids and cholesterol at 70 years of age, then have this test every 5 years. You may need to have your cholesterol levels checked more often if:  Your lipid or cholesterol levels are high.  You are older than 70 years of age.  You are at high risk for heart disease. What should I know about cancer screening? Many types of cancers can be detected early and may often be prevented. Depending on your health history and family history, you may need to have cancer screening at various ages. This may include screening for:  Colorectal cancer.  Prostate  cancer.  Skin cancer.  Lung cancer. What should I know about heart disease, diabetes, and high blood pressure? Blood pressure and heart disease  High blood pressure causes heart disease and increases the risk of stroke. This is more likely to develop in people who have high blood pressure readings, are of African descent, or are overweight.  Talk with your health care provider about your target blood pressure readings.  Have your blood pressure checked: ? Every 3-5 years if you are 18-39 years of age. ? Every year if you are 40 years old or older.  If you are between the ages of 65 and 75 and are a current or former smoker, ask your health care provider if you should have a one-time screening for abdominal aortic aneurysm (AAA). Diabetes Have regular diabetes screenings. This checks your fasting blood sugar level. Have the screening done:  Once every three years after age 45 if you are at a normal weight and have a low risk for diabetes.  More often and at a younger age if you are overweight or have a high risk for diabetes. What should I know about preventing infection? Hepatitis B If you have a higher risk for hepatitis B, you should be screened for this virus. Talk with your health care provider to find out if you are at risk for hepatitis B infection. Hepatitis C Blood testing is recommended for:  Everyone born from 1945 through 1965.  Anyone with known risk factors for hepatitis C. Sexually transmitted infections (STIs)  You should be screened each year   for STIs, including gonorrhea and chlamydia, if: ? You are sexually active and are younger than 70 years of age. ? You are older than 70 years of age and your health care provider tells you that you are at risk for this type of infection. ? Your sexual activity has changed since you were last screened, and you are at increased risk for chlamydia or gonorrhea. Ask your health care provider if you are at risk.  Ask your  health care provider about whether you are at high risk for HIV. Your health care provider may recommend a prescription medicine to help prevent HIV infection. If you choose to take medicine to prevent HIV, you should first get tested for HIV. You should then be tested every 3 months for as long as you are taking the medicine. Follow these instructions at home: Lifestyle  Do not use any products that contain nicotine or tobacco, such as cigarettes, e-cigarettes, and chewing tobacco. If you need help quitting, ask your health care provider.  Do not use street drugs.  Do not share needles.  Ask your health care provider for help if you need support or information about quitting drugs. Alcohol use  Do not drink alcohol if your health care provider tells you not to drink.  If you drink alcohol: ? Limit how much you have to 0-2 drinks a day. ? Be aware of how much alcohol is in your drink. In the [place] one drink equals one 12 oz bottle of beer (355 mL), one 5 oz glass of wine (148 mL), or one 1 oz glass of hard liquor (44 mL). General instructions  Schedule regular health, dental, and eye exams.  Stay current with your vaccines.  Tell your health care provider if: ? You often feel depressed. ? You have ever been abused or do not feel safe at home. Summary  Adopting a healthy lifestyle and getting preventive care are important in promoting health and wellness.  Follow your health care provider's instructions about healthy diet, exercising, and getting tested or screened for diseases.  Follow your health care provider's instructions on monitoring your cholesterol and blood pressure. This information is not intended to replace advice given to you by your health care provider. Make sure you discuss any questions you have with your health care provider. Document Revised: 08/19/2018 Document Reviewed: 08/19/2018 Elsevier Patient Education  [place],  Adult The ears produce a substance called earwax that helps keep bacteria out of the ear and protects the skin in the ear canal. Occasionally, earwax can build up in the ear and cause discomfort or hearing loss. What increases the risk? This condition is more likely to develop in people who:  Are male.  Are elderly.  Naturally produce more earwax.  Clean their ears often with cotton swabs.  Use earplugs often.  Use in-ear headphones often.  Wear hearing aids.  Have narrow ear canals.  Have earwax that is overly thick or sticky.  Have eczema.  Are dehydrated.  Have excess hair in the ear canal. What are the signs or symptoms? Symptoms of this condition include:  Reduced or muffled hearing.  A feeling of fullness in the ear or feeling that the ear is plugged.  Fluid coming from the ear.  Ear pain.  Ear itch.  Ringing in the ear.  Coughing.  An obvious piece of earwax that can be seen inside the ear canal. How is this diagnosed? This condition may be  diagnosed based on:  Your symptoms.  Your medical history.  An ear exam. During the exam, your health care provider will look into your ear with an instrument called an otoscope. You may have tests, including a hearing test. How is this treated? This condition may be treated by:  Using ear drops to soften the earwax.  Having the earwax removed by a health care provider. The health care provider may: ? Flush the ear with water. ? Use an instrument that has a loop on the end (curette). ? Use a suction device.  Surgery to remove the wax buildup. This may be done in severe cases. Follow these instructions at home:   Take over-the-counter and prescription medicines only as told by your health care provider.  Do not put any objects, including cotton swabs, into your ear. You can clean the opening of your ear canal with a washcloth or facial tissue.  Follow instructions from your health care provider about  cleaning your ears. Do not over-clean your ears.  Drink enough fluid to keep your urine clear or pale yellow. This will help to thin the earwax.  Keep all follow-up visits as told by your health care provider. If earwax builds up in your ears often or if you use hearing aids, consider seeing your health care provider for routine, preventive ear cleanings. Ask your health care provider how often you should schedule your cleanings.  If you have hearing aids, clean them according to instructions from the manufacturer and your health care provider. Contact a health care provider if:  You have ear pain.  You develop a fever.  You have blood, pus, or other fluid coming from your ear.  You have hearing loss.  You have ringing in your ears that does not go away.  Your symptoms do not improve with treatment.  You feel like the room is spinning (vertigo). Summary  Earwax can build up in the ear and cause discomfort or hearing loss.  The most common symptoms of this condition include reduced or muffled hearing and a feeling of fullness in the ear or feeling that the ear is plugged.  This condition may be diagnosed based on your symptoms, your medical history, and an ear exam.  This condition may be treated by using ear drops to soften the earwax or by having the earwax removed by a health care provider.  Do not put any objects, including cotton swabs, into your ear. You can clean the opening of your ear canal with a washcloth or facial tissue. This information is not intended to replace advice given to you by your health care provider. Make sure you discuss any questions you have with your health care provider. Document Revised: 08/08/2017 Document Reviewed: 11/06/2016 Elsevier Patient Education  2020 [employer].

## 2020-08-22 LAB — LIPID PANEL
Chol/HDL Ratio: 5.8 ratio — ABNORMAL HIGH (ref 0.0–5.0)
Cholesterol, Total: 225 mg/dL — ABNORMAL HIGH (ref 100–199)
HDL: 39 mg/dL — ABNORMAL LOW (ref >39)
LDL Chol Calc (NIH): 140 mg/dL — ABNORMAL HIGH (ref 0–99)
Triglycerides: 256 mg/dL — ABNORMAL HIGH (ref 0–149)
VLDL Cholesterol Cal: 46 mg/dL — ABNORMAL HIGH (ref 5–40)

## 2020-08-22 LAB — CMP14+EGFR
ALT: 29 IU/L (ref 0–44)
AST: 22 IU/L (ref 0–40)
Albumin/Globulin Ratio: 1.3 (ref 1.2–2.2)
Albumin: 4 g/dL (ref 3.8–4.8)
Alkaline Phosphatase: 78 IU/L (ref 44–121)
BUN/Creatinine Ratio: 14 (ref 10–24)
BUN: 16 mg/dL (ref 8–27)
Bilirubin Total: 0.6 mg/dL (ref 0.0–1.2)
CO2: 25 mmol/L (ref 20–29)
Calcium: 8.9 mg/dL (ref 8.6–10.2)
Chloride: 101 mmol/L (ref 96–106)
Creatinine, Ser: 1.16 mg/dL (ref 0.76–1.27)
GFR calc Af Amer: 73 mL/min/{1.73_m2} (ref >59)
GFR calc non Af Amer: 63 mL/min/{1.73_m2} (ref >59)
Globulin, Total: 3 g/dL (ref 1.5–4.5)
Glucose: 97 mg/dL (ref 65–99)
Potassium: 4.3 mmol/L (ref 3.5–5.2)
Sodium: 139 mmol/L (ref 134–144)
Total Protein: 7 g/dL (ref 6.0–8.5)

## 2020-08-22 LAB — CBC
Hematocrit: 46.9 % (ref 37.5–51.0)
Hemoglobin: 15.8 g/dL (ref 13.0–17.7)
MCH: 31.5 pg (ref 26.6–33.0)
MCHC: 33.7 g/dL (ref 31.5–35.7)
MCV: 94 fL (ref 79–97)
Platelets: 148 10*3/uL — ABNORMAL LOW (ref 150–450)
RBC: 5.01 x10E6/uL (ref 4.14–5.80)
RDW: 11.5 % — ABNORMAL LOW (ref 11.6–15.4)
WBC: 5.1 10*3/uL (ref 3.4–10.8)

## 2020-08-22 LAB — PSA: Prostate Specific Ag, Serum: 0.5 ng/mL (ref 0.0–4.0)

## 2020-08-22 LAB — HEMOGLOBIN A1C
Est. average glucose Bld gHb Est-mCnc: 100 mg/dL
Hgb A1c MFr Bld: 5.1 % (ref 4.8–5.6)

## 2020-08-24 MED ORDER — ATORVASTATIN CALCIUM 10 MG PO TABS: 10.0000 mg | ORAL_TABLET | Freq: Every day | ORAL | 11 refills | Status: DC

## 2020-09-21 ENCOUNTER — Encounter: Payer: Self-pay | Admitting: Nurse Practitioner

## 2020-10-03 ENCOUNTER — Ambulatory Visit: Payer: Commercial Managed Care - PPO | Admitting: Nurse Practitioner

## 2020-10-17 ENCOUNTER — Other Ambulatory Visit: Payer: Self-pay

## 2020-10-17 ENCOUNTER — Ambulatory Visit (INDEPENDENT_AMBULATORY_CARE_PROVIDER_SITE_OTHER): Payer: Self-pay | Admitting: Nurse Practitioner

## 2020-10-17 ENCOUNTER — Encounter: Payer: Self-pay | Admitting: Nurse Practitioner

## 2020-10-17 VITALS — BP 144/80 | HR 63 | Temp 98.3°F | Ht 64.6 in | Wt 221.4 lb

## 2020-10-17 DIAGNOSIS — Z23 Encounter for immunization: Secondary | ICD-10-CM

## 2020-10-17 DIAGNOSIS — I10 Essential (primary) hypertension: Secondary | ICD-10-CM

## 2020-10-17 DIAGNOSIS — E7800 Pure hypercholesterolemia, unspecified: Secondary | ICD-10-CM

## 2020-10-17 MED ORDER — SHINGRIX 50 MCG/0.5ML IM SUSR: 0.5000 mL | Freq: Once | INTRAMUSCULAR | 0 refills | Status: AC

## 2020-10-17 MED ORDER — PNEUMOCOCCAL 13-VAL CONJ VACC IM SUSP: 0.5000 mL | INTRAMUSCULAR | 0 refills | Status: AC

## 2020-10-17 NOTE — Patient Instructions (Signed)

## 2020-10-17 NOTE — Progress Notes (Signed)
I,Yamilka Roman [employer] as a Education administrator for [employer], FNP.,have documented all relevant documentation on the behalf of [name], FNP,as directed by  [name], FNP while in the presence of [name], [place]. This visit occurred during the SARS-CoV-2 public health emergency.  Safety protocols were in place, including screening questions prior to the visit, additional usage of staff PPE, and extensive cleaning of exam room while observing appropriate contact time as indicated for disinfecting solutions.  Subjective:     Patient ID: [name] , male    DOB: 1950-09-20 , 70 y.o.   MRN: [id number]   Chief Complaint  Patient presents with  . Hypertension    HPI  Patient presents today for a blood pressure f/u.  He reports since retiring at the end of the year, he has not been sleeping well. He does report he is rested after sleeping through the night.  Wt Readings from Last 3 Encounters: 10/17/20 : 221 lb 6.4 oz (100.4 kg) 08/21/20 : 221 lb (100.2 kg) 08/16/20 : 206 lb 5.6 oz (93.6 kg)     Past Medical History:  Diagnosis Date  . Hypertension      Family History  Problem Relation Age of Onset  . Dementia Father   . Colon cancer Neg Hx   . Colon polyps Neg Hx   . Esophageal cancer Neg Hx   . Rectal cancer Neg Hx   . Stomach cancer Neg Hx      Current Outpatient Medications:  .  atorvastatin (LIPITOR) 10 MG tablet, Take 1 tablet (10 mg total) by mouth daily., Disp: 30 tablet, Rfl: 11 .  losartan (COZAAR) 100 MG tablet, Take 1 tablet (100 mg total) by mouth daily., Disp: 90 tablet, Rfl: 1 .  losartan (COZAAR) 50 MG tablet, TAKE 1 TABLET BY MOUTH EVERY DAY, Disp: 90 tablet, Rfl: 1   No Known Allergies   Review of Systems  Constitutional: Negative.   HENT: Negative.   Eyes: Negative.   Respiratory: Negative.   Cardiovascular: Negative.   Gastrointestinal: Negative.   Endocrine: Negative.   Genitourinary: Negative.   Musculoskeletal: Negative.    Skin: Negative.   Neurological: Negative.   Hematological: Negative.   Psychiatric/Behavioral: Negative.      Today's Vitals   10/17/20 1037  BP: (!) 144/80  Pulse: 63  Temp: 98.3 F (36.8 C)  TempSrc: Oral  Weight: 221 lb 6.4 oz (100.4 kg)  Height: 5' 4.6" (1.641 m)  PainSc: 0-No pain   Body mass index is 37.3 kg/m.   Objective:  Physical Exam Constitutional:      General: He is not in acute distress.    Appearance: Normal appearance. He is obese.  Cardiovascular:     Rate and Rhythm: Normal rate and regular rhythm.     Pulses: Normal pulses.     Heart sounds: Normal heart sounds. No murmur heard.   Pulmonary:     Effort: Pulmonary effort is normal. No respiratory distress.     Breath sounds: Normal breath sounds. No wheezing.  Abdominal:     General: Abdomen is flat. Bowel sounds are normal.     Palpations: Abdomen is soft.  Musculoskeletal:        General: Normal range of motion.     Cervical back: Normal range of motion and neck supple.  Skin:    General: Skin is warm and dry.     Capillary Refill: Capillary refill takes less than 2 seconds.     Coloration: Skin  is not jaundiced.  Neurological:     General: No focal deficit present.     Mental Status: He is alert and oriented to person, place, and time.     Cranial Nerves: No cranial nerve deficit.  Psychiatric:        Mood and Affect: Mood normal.        Behavior: Behavior normal.        Thought Content: Thought content normal.        Judgment: Judgment normal.         Assessment And Plan:     1. Essential hypertension  Chronic, fair control but better   He has gained approximately 10 lbs since his last office visit which may be causing his blood pressure to increase - Lipid panel; Future  2. Elevated cholesterol  Chronic, stable  Continue with current medications, tolerating well  He is encouraged to avoid high fat diet - CMP14+EGFR; Future  3. Encounter for immunization -  pneumococcal 13-valent conjugate vaccine (PREVNAR 13) SUSP injection; Inject 0.5 mLs into the muscle tomorrow at 10 am for 1 dose.  Dispense: 0.5 mL; Refill: 0 - Zoster Vaccine Adjuvanted [facility]) injection; Inject 0.5 mLs into the muscle once for 1 dose.  Dispense: 0.5 mL; Refill: 0     Patient was given opportunity to ask questions. Patient verbalized understanding of the plan and was able to repeat key elements of the plan. All questions were answered to their satisfaction.  [name], FNP    I, [name], FNP, have reviewed all documentation for this visit. The documentation on 10/17/20 for the exam, diagnosis, procedures, and orders are all accurate and complete.   THE PATIENT IS ENCOURAGED TO PRACTICE SOCIAL DISTANCING DUE TO THE COVID-19 PANDEMIC.

## 2020-10-18 ENCOUNTER — Other Ambulatory Visit: Payer: Self-pay | Admitting: Nurse Practitioner

## 2020-10-18 DIAGNOSIS — I10 Essential (primary) hypertension: Secondary | ICD-10-CM

## 2020-10-23 ENCOUNTER — Other Ambulatory Visit: Payer: Self-pay | Admitting: Nurse Practitioner

## 2020-11-14 ENCOUNTER — Other Ambulatory Visit: Payer: Self-pay

## 2020-11-14 DIAGNOSIS — I10 Essential (primary) hypertension: Secondary | ICD-10-CM

## 2020-11-14 DIAGNOSIS — E7800 Pure hypercholesterolemia, unspecified: Secondary | ICD-10-CM

## 2020-11-15 LAB — LIPID PANEL
Chol/HDL Ratio: 5.6 ratio — ABNORMAL HIGH (ref 0.0–5.0)
Cholesterol, Total: 200 mg/dL — ABNORMAL HIGH (ref 100–199)
HDL: 36 mg/dL — ABNORMAL LOW (ref >39)
LDL Chol Calc (NIH): 118 mg/dL — ABNORMAL HIGH (ref 0–99)
Triglycerides: 262 mg/dL — ABNORMAL HIGH (ref 0–149)
VLDL Cholesterol Cal: 46 mg/dL — ABNORMAL HIGH (ref 5–40)

## 2020-11-15 LAB — CMP14+EGFR
ALT: 27 IU/L (ref 0–44)
AST: 21 IU/L (ref 0–40)
Albumin/Globulin Ratio: 1.5 (ref 1.2–2.2)
Albumin: 4.3 g/dL (ref 3.8–4.8)
Alkaline Phosphatase: 78 IU/L (ref 44–121)
BUN/Creatinine Ratio: 14 (ref 10–24)
BUN: 16 mg/dL (ref 8–27)
Bilirubin Total: 0.5 mg/dL (ref 0.0–1.2)
CO2: 23 mmol/L (ref 20–29)
Calcium: 8.9 mg/dL (ref 8.6–10.2)
Chloride: 101 mmol/L (ref 96–106)
Creatinine, Ser: 1.15 mg/dL (ref 0.76–1.27)
Globulin, Total: 2.9 g/dL (ref 1.5–4.5)
Glucose: 91 mg/dL (ref 65–99)
Potassium: 4.4 mmol/L (ref 3.5–5.2)
Sodium: 140 mmol/L (ref 134–144)
Total Protein: 7.2 g/dL (ref 6.0–8.5)
eGFR: 68 mL/min/{1.73_m2} (ref >59)

## 2020-11-17 ENCOUNTER — Telehealth: Payer: Self-pay

## 2020-11-17 NOTE — Telephone Encounter (Signed)
Left the patient a message to call back for lab results. 

## 2020-11-17 NOTE — Telephone Encounter (Signed)
-----   Message from [name], [place] sent at 11/16/2020 11:36 PM EST ----- Cholesterol total is better at 200, triglycerides is up to 262 be sure to cut back on breads and sweets. Good cholesterol HDL is low increase intake of fish and nuts. LDL is better. Kidney and liver functions are normal

## 2021-01-15 ENCOUNTER — Ambulatory Visit: Payer: Self-pay | Admitting: Nurse Practitioner

## 2021-02-15 ENCOUNTER — Other Ambulatory Visit: Payer: Self-pay | Admitting: Nurse Practitioner

## 2021-02-15 DIAGNOSIS — I10 Essential (primary) hypertension: Secondary | ICD-10-CM

## 2021-02-28 ENCOUNTER — Ambulatory Visit: Payer: Self-pay | Admitting: Nurse Practitioner

## 2021-03-27 ENCOUNTER — Ambulatory Visit: Payer: Self-pay | Admitting: Nurse Practitioner

## 2021-04-09 ENCOUNTER — Other Ambulatory Visit: Payer: Self-pay

## 2021-04-09 ENCOUNTER — Ambulatory Visit (INDEPENDENT_AMBULATORY_CARE_PROVIDER_SITE_OTHER): Payer: Medicare Other | Admitting: Nurse Practitioner

## 2021-04-09 ENCOUNTER — Encounter: Payer: Self-pay | Admitting: Nurse Practitioner

## 2021-04-09 VITALS — BP 132/80 | HR 84 | Temp 98.6°F | Ht 64.6 in | Wt 222.4 lb

## 2021-04-09 DIAGNOSIS — I10 Essential (primary) hypertension: Secondary | ICD-10-CM | POA: Diagnosis not present

## 2021-04-09 DIAGNOSIS — E6609 Other obesity due to excess calories: Secondary | ICD-10-CM | POA: Diagnosis not present

## 2021-04-09 DIAGNOSIS — R350 Frequency of micturition: Secondary | ICD-10-CM | POA: Diagnosis not present

## 2021-04-09 DIAGNOSIS — R631 Polydipsia: Secondary | ICD-10-CM | POA: Diagnosis not present

## 2021-04-09 DIAGNOSIS — Z6837 Body mass index (BMI) 37.0-37.9, adult: Secondary | ICD-10-CM

## 2021-04-09 DIAGNOSIS — G4700 Insomnia, unspecified: Secondary | ICD-10-CM

## 2021-04-09 LAB — POCT URINALYSIS DIPSTICK
Bilirubin, UA: NEGATIVE
Blood, UA: NEGATIVE
Glucose, UA: NEGATIVE
Ketones, UA: NEGATIVE
Leukocytes, UA: NEGATIVE
Nitrite, UA: NEGATIVE
Protein, UA: POSITIVE — AB
Spec Grav, UA: 1.02 (ref 1.010–1.025)
Urobilinogen, UA: 0.2 E.U./dL
pH, UA: 5.5 (ref 5.0–8.0)

## 2021-04-09 MED ORDER — MELATONIN 3 MG PO TABS: 3.0000 mg | ORAL_TABLET | Freq: Every day | ORAL | 5 refills | Status: DC

## 2021-04-09 NOTE — Patient Instructions (Addendum)
Hypertension, Adult Hypertension is another name for high blood pressure. High blood pressure forces your heart to work harder to pump blood. This can cause problems overtime. There are two numbers in a blood pressure reading. There is a top number (systolic) over a bottom number (diastolic). It is best to have a blood pressure that is below 120/80. Healthy choicescan help lower your blood pressure, or you may need medicine to help lower it. What are the causes? The cause of this condition is not known. Some conditions may be related tohigh blood pressure. What increases the risk? Smoking. Having type 2 diabetes mellitus, high cholesterol, or both. Not getting enough exercise or physical activity. Being overweight. Having too much fat, sugar, calories, or salt (sodium) in your diet. Drinking too much alcohol. Having long-term (chronic) kidney disease. Having a family history of high blood pressure. Age. Risk increases with age. Race. You may be at higher risk if you are African American. Gender. Men are at higher risk than women before age 20. After age 72, women are at higher risk than men. Having obstructive sleep apnea. Stress. What are the signs or symptoms? High blood pressure may not cause symptoms. Very high blood pressure (hypertensive crisis) may cause: Headache. Feelings of worry or nervousness (anxiety). Shortness of breath. Nosebleed. A feeling of being sick to your stomach (nausea). Throwing up (vomiting). Changes in how you see. Very bad chest pain. Seizures. How is this treated? This condition is treated by making healthy lifestyle changes, such as: Eating healthy foods. Exercising more. Drinking less alcohol. Your health care provider may prescribe medicine if lifestyle changes are not enough to get your blood pressure under control, and if: Your top number is above 130. Your bottom number is above 80. Your personal target blood pressure may vary. Follow these  instructions at home: Eating and drinking  If told, follow the DASH eating plan. To follow this plan: Fill one half of your plate at each meal with fruits and vegetables. Fill one fourth of your plate at each meal with whole grains. Whole grains include whole-wheat pasta, brown rice, and whole-grain bread. Eat or drink low-fat dairy products, such as skim milk or low-fat yogurt. Fill one fourth of your plate at each meal with low-fat (lean) proteins. Low-fat proteins include fish, chicken without skin, eggs, beans, and tofu. Avoid fatty meat, cured and processed meat, or chicken with skin. Avoid pre-made or processed food. Eat less than 1,500 mg of salt each day. Do not drink alcohol if: Your doctor tells you not to drink. You are pregnant, may be pregnant, or are planning to become pregnant. If you drink alcohol: Limit how much you use to: 0-1 drink a day for women. 0-2 drinks a day for men. Be aware of how much alcohol is in your drink. In the [place] one drink equals one 12 oz bottle of beer (355 mL), one 5 oz glass of wine (148 mL), or one 1 oz glass of hard liquor (44 mL).  Lifestyle  Work with your doctor to stay at a healthy weight or to lose weight. Ask your doctor what the best weight is for you. Get at least 30 minutes of exercise most days of the week. This may include walking, swimming, or biking. Get at least 30 minutes of exercise that strengthens your muscles (resistance exercise) at least 3 days a week. This may include lifting weights or doing Pilates. Do not use any products that contain nicotine or tobacco, such as cigarettes,  e-cigarettes, and chewing tobacco. If you need help quitting, ask your doctor. Check your blood pressure at home as told by your doctor. Keep all follow-up visits as told by your doctor. This is important.  Medicines Take over-the-counter and prescription medicines only as told by your doctor. Follow directions carefully. Do not skip doses of  blood pressure medicine. The medicine does not work as well if you skip doses. Skipping doses also puts you at risk for problems. Ask your doctor about side effects or reactions to medicines that you should watch for. Contact a doctor if you: Think you are having a reaction to the medicine you are taking. Have headaches that keep coming back (recurring). Feel dizzy. Have swelling in your ankles. Have trouble with your vision. Get help right away if you: Get a very bad headache. Start to feel mixed up (confused). Feel weak or numb. Feel faint. Have very bad pain in your: Chest. Belly (abdomen). Throw up more than once. Have trouble breathing. Summary Hypertension is another name for high blood pressure. High blood pressure forces your heart to work harder to pump blood. For most people, a normal blood pressure is less than 120/80. Making healthy choices can help lower blood pressure. If your blood pressure does not get lower with healthy choices, you may need to take medicine. This information is not intended to replace advice given to you by your health care provider. Make sure you discuss any questions you have with your healthcare provider. Document Revised: 05/06/2018 Document Reviewed: 05/06/2018 Elsevier Patient Education  [place].   Take melatonin 3 mg at bedtime for sleep as needed

## 2021-04-09 NOTE — Progress Notes (Signed)
I,Yamilka Roman [employer] as a Education administrator for [employer], FNP.,have documented all relevant documentation on the behalf of [name], FNP,as directed by  [name], FNP while in the presence of [name], [place].   This visit occurred during the SARS-CoV-2 public health emergency.  Safety protocols were in place, including screening questions prior to the visit, additional usage of staff PPE, and extensive cleaning of exam room while observing appropriate contact time as indicated for disinfecting solutions.  Subjective:     Patient ID: [name] , male    DOB: [birth year] , 70 y.o.   MRN: [id number]   Chief Complaint  Patient presents with   Hypertension    HPI  Patient presents today for a blood pressure f/u.  He reports since retiring at the end of the year, he has not been sleeping well. He does report he is rested after sleeping through the night.  He does admit to missing doses of his blood pressure medication average 2 days a week. He is not doing much walking but about one day a week. He is trying to get rid of things in the house. He awakens at 730 am and does not have a set schedule, when he wakes up really early.   He does have children that live locally but does not feel he needs any help with his wife.   Wt Readings from Last 3 Encounters: 10/17/20 : 221 lb 6.4 oz (100.4 kg) 08/21/20 : 221 lb (100.2 kg) 08/16/20 : 206 lb 5.6 oz (93.6 kg)   Hypertension This is a chronic problem. The current episode started more than 1 year ago. The problem has been gradually improving since onset. The problem is controlled. Pertinent negatives include no anxiety, chest pain, headaches, palpitations or shortness of breath. There are no associated agents to hypertension. Risk factors for coronary artery disease include sedentary lifestyle and obesity. Past treatments include diuretics and angiotensin blockers. There are no compliance problems.  There is no history of angina.  There is no history of chronic renal disease.    Past Medical History:  Diagnosis Date   Hypertension      Family History  Problem Relation Age of Onset   Dementia Father    Colon cancer Neg Hx    Colon polyps Neg Hx    Esophageal cancer Neg Hx    Rectal cancer Neg Hx    Stomach cancer Neg Hx      Current Outpatient Medications:    atorvastatin (LIPITOR) 10 MG tablet, Take 1 tablet (10 mg total) by mouth daily., Disp: 30 tablet, Rfl: 11   losartan (COZAAR) 100 MG tablet, TAKE 1 TABLET BY MOUTH EVERY DAY, Disp: 90 tablet, Rfl: 1   melatonin 3 MG TABS tablet, Take 1 tablet (3 mg total) by mouth at bedtime., Disp: 30 tablet, Rfl: 5   No Known Allergies   Review of Systems  Constitutional: Negative.   Eyes: Negative.   Respiratory:  Negative for cough, shortness of breath and wheezing.   Cardiovascular:  Negative for chest pain, palpitations and leg swelling.  Gastrointestinal: Negative.   Musculoskeletal: Negative.   Neurological:  Negative for dizziness and headaches.  Psychiatric/Behavioral: Negative.      Today's Vitals   04/09/21 1031  BP: 132/80  Pulse: 84  Temp: 98.6 F (37 C)  Weight: 222 lb 6.4 oz (100.9 kg)  Height: 5' 4.6" (1.641 m)  PainSc: 0-No pain   Body mass index is 37.47 kg/m.   Objective:  Physical Exam Vitals reviewed.  Constitutional:      General: He is not in acute distress.    Appearance: Normal appearance. He is obese.  Cardiovascular:     Rate and Rhythm: Normal rate and regular rhythm.     Pulses: Normal pulses.     Heart sounds: Normal heart sounds. No murmur heard. Pulmonary:     Effort: Pulmonary effort is normal. No respiratory distress.     Breath sounds: Normal breath sounds. No wheezing.  Skin:    General: Skin is warm and dry.     Capillary Refill: Capillary refill takes less than 2 seconds.     Coloration: Skin is not jaundiced.     Findings: No bruising.  Neurological:     General: No focal deficit present.      Mental Status: He is alert and oriented to person, place, and time.     Cranial Nerves: No cranial nerve deficit.     Motor: No weakness.  Psychiatric:        Mood and Affect: Mood normal.        Behavior: Behavior normal.        Thought Content: Thought content normal.        Judgment: Judgment normal.        Assessment And Plan:     1. Essential hypertension Comments: Fair control, continue with current medications  2. Urinary frequency Comments: Will check urinalysis  Will also check for increase in blood sugar Will check PSA  - PSA - BMP8+eGFR - POCT Urinalysis Dipstick (81002)  3. Polydipsia Comments: Will check for elevated blood sugar if increased will add HgbA1c - BMP8+eGFR  4. Class 2 obesity due to excess calories without serious comorbidity with body mass index (BMI) of 37.0 to 37.9 in adult Chronic Discussed importance of a healthy diet and regular exercise options  Encouraged to exercise at least 150 minutes per week with 2 days of strength training as tolerated    5. Insomnia, unspecified type - melatonin 3 MG TABS tablet; Take 1 tablet (3 mg total) by mouth at bedtime.  Dispense: 30 tablet; Refill: 5   Patient was given opportunity to ask questions. Patient verbalized understanding of the plan and was able to repeat key elements of the plan. All questions were answered to their satisfaction.  [name], FNP   I, [name], FNP, have reviewed all documentation for this visit. The documentation on 04/09/2021 for the exam, diagnosis, procedures, and orders are all accurate and complete.   IF YOU HAVE BEEN REFERRED TO A SPECIALIST, IT MAY TAKE 1-2 WEEKS TO SCHEDULE/PROCESS THE REFERRAL. IF YOU HAVE NOT HEARD FROM US/SPECIALIST IN TWO WEEKS, PLEASE GIVE [place] A CALL AT [phone].   THE PATIENT IS ENCOURAGED TO PRACTICE SOCIAL DISTANCING DUE TO THE COVID-19 PANDEMIC.

## 2021-04-10 LAB — BMP8+EGFR
BUN/Creatinine Ratio: 13 (ref 10–24)
BUN: 15 mg/dL (ref 8–27)
CO2: 26 mmol/L (ref 20–29)
Calcium: 9.3 mg/dL (ref 8.6–10.2)
Chloride: 101 mmol/L (ref 96–106)
Creatinine, Ser: 1.17 mg/dL (ref 0.76–1.27)
Glucose: 89 mg/dL (ref 65–99)
Potassium: 4.9 mmol/L (ref 3.5–5.2)
Sodium: 142 mmol/L (ref 134–144)
eGFR: 67 mL/min/{1.73_m2} (ref >59)

## 2021-04-10 LAB — PSA: Prostate Specific Ag, Serum: 0.5 ng/mL (ref 0.0–4.0)

## 2021-05-01 ENCOUNTER — Other Ambulatory Visit: Payer: Self-pay | Admitting: Nurse Practitioner

## 2021-05-01 DIAGNOSIS — Z23 Encounter for immunization: Secondary | ICD-10-CM

## 2021-05-01 MED ORDER — PREVNAR 20 0.5 ML IM SUSY: 0.5000 mL | PREFILLED_SYRINGE | INTRAMUSCULAR | 0 refills | Status: AC

## 2021-05-28 ENCOUNTER — Ambulatory Visit (INDEPENDENT_AMBULATORY_CARE_PROVIDER_SITE_OTHER): Payer: Medicare Other | Admitting: Nurse Practitioner

## 2021-05-28 ENCOUNTER — Other Ambulatory Visit: Payer: Self-pay

## 2021-05-28 ENCOUNTER — Encounter: Payer: Self-pay | Admitting: Nurse Practitioner

## 2021-05-28 VITALS — BP 136/80 | HR 74 | Temp 98.7°F | Ht 64.6 in | Wt 219.6 lb

## 2021-05-28 DIAGNOSIS — E6609 Other obesity due to excess calories: Secondary | ICD-10-CM | POA: Diagnosis not present

## 2021-05-28 DIAGNOSIS — Z6837 Body mass index (BMI) 37.0-37.9, adult: Secondary | ICD-10-CM | POA: Diagnosis not present

## 2021-05-28 DIAGNOSIS — G4700 Insomnia, unspecified: Secondary | ICD-10-CM | POA: Diagnosis not present

## 2021-05-28 DIAGNOSIS — Z23 Encounter for immunization: Secondary | ICD-10-CM | POA: Diagnosis not present

## 2021-05-28 MED ORDER — MELATONIN 10 MG PO TABS: 10.0000 mg | ORAL_TABLET | Freq: Every day | ORAL | 1 refills | Status: DC

## 2021-05-28 NOTE — Progress Notes (Signed)
I,Katawbba Wiggins,acting as a Education administrator for [employer], FNP.,have documented all relevant documentation on the behalf of [name], FNP,as directed by  [name], FNP while in the presence of [name], [place].   This visit occurred during the SARS-CoV-2 public health emergency.  Safety protocols were in place, including screening questions prior to the visit, additional usage of staff PPE, and extensive cleaning of exam room while observing appropriate contact time as indicated for disinfecting solutions.  Subjective:     Patient ID: [name] , male    DOB: [birth year] , 70 y.o.   MRN: [id number]   Chief Complaint  Patient presents with   Insomnia    HPI  The patient is here for a follow-up on insomnia.  The patient was recently started on Melatonin 3 mg.  The patient said he takes it nightly.  He will sleep an average of 4 hours a night. He tries to sleep light due caring for his wife with dementia. He has a 2 way lock. He will sleep sitting up in the chair and will sometimes fall asleep before the sun goes down.   Insomnia Primary symptoms: difficulty falling asleep.   The onset quality is gradual. The problem occurs nightly. The problem is unchanged. The treatment provided mild relief. Typical bedtime:  Other (varies - he is now retired and feels his sleep issues have occurred since retiring).  PMH includes: no hypertension, no depression, family stress or anxiety (he is caring for his wife who has Dementia), no chronic pain.     Past Medical History:  Diagnosis Date   Hypertension      Family History  Problem Relation Age of Onset   Dementia Father    Colon cancer Neg Hx    Colon polyps Neg Hx    Esophageal cancer Neg Hx    Rectal cancer Neg Hx    Stomach cancer Neg Hx      Current Outpatient Medications:    atorvastatin (LIPITOR) 10 MG tablet, Take 1 tablet (10 mg total) by mouth daily., Disp: 30 tablet, Rfl: 11   losartan (COZAAR) 100 MG tablet, TAKE 1  TABLET BY MOUTH EVERY DAY, Disp: 90 tablet, Rfl: 1   melatonin 10 MG TABS, Take 10 mg by mouth at bedtime., Disp: 30 tablet, Rfl: 1   No Known Allergies   Review of Systems  Constitutional: Negative.   Respiratory: Negative.    Cardiovascular: Negative.   Gastrointestinal: Negative.   Neurological:  Negative for dizziness and headaches.  Psychiatric/Behavioral:  Negative for depression. The patient has insomnia.   All other systems reviewed and are negative.   Today's Vitals   05/28/21 1039  BP: 136/80  Pulse: 74  Temp: 98.7 F (37.1 C)  TempSrc: Oral  Weight: 219 lb 9.6 oz (99.6 kg)  Height: 5' 4.6" (1.641 m)  PainSc: 0-No pain   Body mass index is 37 kg/m.   Objective:  Physical Exam Vitals reviewed.  Constitutional:      General: He is not in acute distress.    Appearance: Normal appearance. He is obese.  Cardiovascular:     Rate and Rhythm: Normal rate and regular rhythm.     Pulses: Normal pulses.     Heart sounds: Normal heart sounds. No murmur heard. Pulmonary:     Effort: Pulmonary effort is normal. No respiratory distress.     Breath sounds: Normal breath sounds. No wheezing.  Neurological:     General: No focal deficit present.  Mental Status: He is alert and oriented to person, place, and time.     Cranial Nerves: No cranial nerve deficit.     Motor: No weakness.  Psychiatric:        Mood and Affect: Mood normal.        Behavior: Behavior normal.        Thought Content: Thought content normal.        Judgment: Judgment normal.        Assessment And Plan:     1. Insomnia, unspecified type Comments: Not much better, will increase melatonin to 10 mg nightly and strongly encourage good sleep hygeine, sleep in bed when sleepy. Avoid taking naps during day - melatonin 10 MG TABS; Take 10 mg by mouth at bedtime.  Dispense: 30 tablet; Refill: 1  2. Immunization due Influenza vaccine administered Encouraged to take Tylenol as needed for fever or  muscle aches. - Flu Vaccine QUAD High Dose(Fluad)  3. Class 2 obesity due to excess calories without serious comorbidity with body mass index (BMI) of 37.0 to 37.9 in adult Encouraged to walk the driveway during the day especially when feeling sleepy this will help to avoid him taking a nap at that time - melatonin 10 MG TABS; Take 10 mg by mouth at bedtime.  Dispense: 30 tablet; Refill: 1    Patient was given opportunity to ask questions. Patient verbalized understanding of the plan and was able to repeat key elements of the plan. All questions were answered to their satisfaction.  [name], FNP   I, [name], FNP, have reviewed all documentation for this visit. The documentation on 05/28/21 for the exam, diagnosis, procedures, and orders are all accurate and complete.   IF YOU HAVE BEEN REFERRED TO A SPECIALIST, IT MAY TAKE 1-2 WEEKS TO SCHEDULE/PROCESS THE REFERRAL. IF YOU HAVE NOT HEARD FROM US/SPECIALIST IN TWO WEEKS, PLEASE GIVE [place] A CALL AT [phone].   THE PATIENT IS ENCOURAGED TO PRACTICE SOCIAL DISTANCING DUE TO THE COVID-19 PANDEMIC.

## 2021-05-28 NOTE — Patient Instructions (Signed)
Insomnia Insomnia is a sleep disorder that makes it difficult to fall asleep or stay asleep. Insomnia can cause fatigue, low energy, difficulty concentrating, moodswings, and poor performance at work or school. There are three different ways to classify insomnia: Difficulty falling asleep. Difficulty staying asleep. Waking up too early in the morning. Any type of insomnia can be long-term (chronic) or short-term (acute). Both are common. Short-term insomnia usually lasts for three months or less. Chronic insomnia occurs at least three times a week for longer than threemonths. What are the causes? Insomnia may be caused by another condition, situation, or substance, such as: Anxiety. Certain medicines. Gastroesophageal reflux disease (GERD) or other gastrointestinal conditions. Asthma or other breathing conditions. Restless legs syndrome, sleep apnea, or other sleep disorders. Chronic pain. Menopause. Stroke. Abuse of alcohol, tobacco, or illegal drugs. Mental health conditions, such as depression. Caffeine. Neurological disorders, such as Alzheimer's disease. An overactive thyroid (hyperthyroidism). Sometimes, the cause of insomnia may not be known. What increases the risk? Risk factors for insomnia include: Gender. Women are affected more often than men. Age. Insomnia is more common as you get older. Stress. Lack of exercise. Irregular work schedule or working night shifts. Traveling between different time zones. Certain medical and mental health conditions. What are the signs or symptoms? If you have insomnia, the main symptom is having trouble falling asleep or having trouble staying asleep. This may lead to other symptoms, such as: Feeling fatigued or having low energy. Feeling nervous about going to sleep. Not feeling rested in the morning. Having trouble concentrating. Feeling irritable, anxious, or depressed. How is this diagnosed? This condition may be diagnosed based  on: Your symptoms and medical history. Your health care provider may ask about: Your sleep habits. Any medical conditions you have. Your mental health. A physical exam. How is this treated? Treatment for insomnia depends on the cause. Treatment may focus on treating an underlying condition that is causing insomnia. Treatment may also include: Medicines to help you sleep. Counseling or therapy. Lifestyle adjustments to help you sleep better. Follow these instructions at home: Eating and drinking  Limit or avoid alcohol, caffeinated beverages, and cigarettes, especially close to bedtime. These can disrupt your sleep. Do not eat a large meal or eat spicy foods right before bedtime. This can lead to digestive discomfort that can make it hard for you to sleep.  Sleep habits  Keep a sleep diary to help you and your health care provider figure out what could be causing your insomnia. Write down: When you sleep. When you wake up during the night. How well you sleep. How rested you feel the next day. Any side effects of medicines you are taking. What you eat and drink. Make your bedroom a dark, comfortable place where it is easy to fall asleep. Put up shades or blackout curtains to block light from outside. Use a white noise machine to block noise. Keep the temperature cool. Limit screen use before bedtime. This includes: Watching TV. Using your smartphone, tablet, or computer. Stick to a routine that includes going to bed and waking up at the same times every day and night. This can help you fall asleep faster. Consider making a quiet activity, such as reading, part of your nighttime routine. Try to avoid taking naps during the day so that you sleep better at night. Get out of bed if you are still awake after 15 minutes of trying to sleep. Keep the lights down, but try reading or doing a quiet   activity. When you feel sleepy, go back to bed.  General instructions Take over-the-counter  and prescription medicines only as told by your health care provider. Exercise regularly, as told by your health care provider. Avoid exercise starting several hours before bedtime. Use relaxation techniques to manage stress. Ask your health care provider to suggest some techniques that may work well for you. These may include: Breathing exercises. Routines to release muscle tension. Visualizing peaceful scenes. Make sure that you drive carefully. Avoid driving if you feel very sleepy. Keep all follow-up visits as told by your health care provider. This is important. Contact a health care provider if: You are tired throughout the day. You have trouble in your daily routine due to sleepiness. You continue to have sleep problems, or your sleep problems get worse. Get help right away if: You have serious thoughts about hurting yourself or someone else. If you ever feel like you may hurt yourself or others, or have thoughts about taking your own life, get help right away. You can go to your nearest emergency department or call: Your local emergency services (911 in the [place] A suicide crisis helpline, such as the National Suicide Prevention Lifeline at 1-800-273-8255. This is open 24 hours a day. Summary Insomnia is a sleep disorder that makes it difficult to fall asleep or stay asleep. Insomnia can be long-term (chronic) or short-term (acute). Treatment for insomnia depends on the cause. Treatment may focus on treating an underlying condition that is causing insomnia. Keep a sleep diary to help you and your health care provider figure out what could be causing your insomnia. This information is not intended to replace advice given to you by your health care provider. Make sure you discuss any questions you have with your healthcare provider. Document Revised: 07/06/2020 Document Reviewed: 07/06/2020 Elsevier Patient Education  2022 [employer]  

## 2021-08-12 ENCOUNTER — Other Ambulatory Visit: Payer: Self-pay | Admitting: Nurse Practitioner

## 2021-08-12 DIAGNOSIS — I10 Essential (primary) hypertension: Secondary | ICD-10-CM

## 2021-08-20 ENCOUNTER — Other Ambulatory Visit: Payer: Self-pay | Admitting: Nurse Practitioner

## 2021-08-20 DIAGNOSIS — E782 Mixed hyperlipidemia: Secondary | ICD-10-CM

## 2021-08-22 ENCOUNTER — Ambulatory Visit: Payer: Medicare Other | Admitting: Nurse Practitioner

## 2021-09-07 ENCOUNTER — Other Ambulatory Visit: Payer: Self-pay | Admitting: Nurse Practitioner

## 2021-09-07 DIAGNOSIS — E6609 Other obesity due to excess calories: Secondary | ICD-10-CM

## 2021-09-24 ENCOUNTER — Ambulatory Visit: Payer: Medicare Other | Admitting: Nurse Practitioner

## 2021-10-30 ENCOUNTER — Ambulatory Visit: Payer: Medicare Other | Admitting: Nurse Practitioner

## 2021-11-05 ENCOUNTER — Encounter: Payer: Self-pay | Admitting: Nurse Practitioner

## 2021-11-05 ENCOUNTER — Ambulatory Visit (INDEPENDENT_AMBULATORY_CARE_PROVIDER_SITE_OTHER): Payer: Medicare Other | Admitting: Nurse Practitioner

## 2021-11-05 ENCOUNTER — Other Ambulatory Visit: Payer: Self-pay

## 2021-11-05 VITALS — BP 142/82 | HR 84 | Temp 98.6°F | Ht 64.6 in | Wt 221.2 lb

## 2021-11-05 DIAGNOSIS — H1010 Acute atopic conjunctivitis, unspecified eye: Secondary | ICD-10-CM

## 2021-11-05 DIAGNOSIS — H5789 Other specified disorders of eye and adnexa: Secondary | ICD-10-CM

## 2021-11-05 DIAGNOSIS — E785 Hyperlipidemia, unspecified: Secondary | ICD-10-CM

## 2021-11-05 DIAGNOSIS — Z6837 Body mass index (BMI) 37.0-37.9, adult: Secondary | ICD-10-CM

## 2021-11-05 DIAGNOSIS — I10 Essential (primary) hypertension: Secondary | ICD-10-CM

## 2021-11-05 DIAGNOSIS — E6609 Other obesity due to excess calories: Secondary | ICD-10-CM

## 2021-11-05 DIAGNOSIS — G4700 Insomnia, unspecified: Secondary | ICD-10-CM

## 2021-11-05 MED ORDER — FLUTICASONE PROPIONATE 50 MCG/ACT NA SUSP: 2.0000 | Freq: Every day | NASAL | 2 refills | Status: DC

## 2021-11-05 MED ORDER — ERYTHROMYCIN 5 MG/GM OP OINT: 1.0000 "application " | TOPICAL_OINTMENT | Freq: Every day | OPHTHALMIC | 0 refills | Status: DC

## 2021-11-05 MED ORDER — AZELASTINE HCL 0.05 % OP SOLN: 1.0000 [drp] | Freq: Two times a day (BID) | OPHTHALMIC | 5 refills | Status: DC

## 2021-11-05 NOTE — Patient Instructions (Signed)
Allergic Conjunctivitis, Adult Allergic conjunctivitis is inflammation of the conjunctiva. The conjunctiva is the thin, clear membrane that covers the white part of the eye and the inner surface of the eyelid. In this condition: The blood vessels in the conjunctiva become irritated and swell. The eyes become red or pink and feel itchy. Allergic conjunctivitis cannot be spread from person to person. This condition can develop at any age and may be outgrown. What are the causes? This condition is caused by allergens. These are things that can cause an allergic reaction in some people but not in other people. Common allergens include: Outdoor allergens, such as: Pollen, including pollen from grass and weeds. Mold spores. Car fumes. Indoor allergens, such as: Dust. Smoke. Mold spores. Proteins in a pet's urine, saliva, or dander. What increases the risk? You may be more likely to develop this condition if you have a family history of these things: Allergies. Conditions caused by being exposed to allergens, such as: Allergic rhinitis. This is an allergic reaction that affects the nose. Bronchial asthma. This condition affects the large airways in the lungs and makes breathing difficult. Atopic dermatitis (eczema). This is inflammation of the skin that is long-term (chronic). What are the signs or symptoms? Symptoms of this condition include eyes that are: Itchy. Red. Watery. Puffy. Your eyes may also: Sting or burn. Have clear fluid draining from them. Have thick mucus discharge and pain (vernal conjunctivitis). How is this diagnosed? This condition may be diagnosed by: Your medical history. A physical exam. Tests of the fluid draining from your eyes to rule out other causes. Other tests to confirm the diagnosis, including: Testing for allergies. The skin may be pricked with a tiny needle. The pricked area is then exposed to small amounts of allergens. Testing for other eye  conditions. Tests may include: Blood tests. Tissue scrapings from your eyelid. The tissue is then checked under a microscope. How is this treated? This condition may be treated with: Cold, wet cloths (cold compresses) to soothe itching and swelling. Washing the face to remove allergens. Eye drops. These may be prescription or over-the-counter. You may need to try different types to see which one works best for you, such as: Eye drops that block the allergic reaction (antihistamine). Eye drops that reduce swelling and irritation (anti-inflammatory). Steroid eye drops, which may be given if other treatments have not worked (vernal conjunctivitis). Oral antihistamine medicines. These are medicines taken by mouth to lessen your allergic reaction. You may need these if eye drops do not help or are difficult to use. Follow these instructions at home: Eye care Apply a clean, cold compress to your eyes for 10-20 minutes, 3-4 times a day. Do not touch or rub your eyes. Do not wear contact lenses until the inflammation is gone. Wear glasses instead. Do not wear eye makeup until the inflammation is gone. General instructions Avoid known allergens whenever possible. Take or apply over-the-counter and prescription medicines only as told by your health care provider. These include any eye drops. Drink enough fluid to keep your urine pale yellow. Keep all follow-up visits as told by your health care provider. This is important. Contact a health care provider if: Your symptoms get worse or do not get better with treatment. You have mild eye pain. You become sensitive to light. You have spots or blisters on your eyes. You have pus draining from your eyes. You have a fever. Get help right away if: You have redness, swelling, or other symptoms in  only one eye. Your vision is blurred or you have other vision changes. You have severe eye pain. Summary Allergic conjunctivitis is inflammation of the  clear membrane that covers the white part of the eye and the inner surface of the eyelid. Take or apply over-the-counter and prescription medicines only as told by your health care provider. These include eye drops. Do not touch or rub your eyes. Contact a health care provider if your symptoms get worse or do not get better with treatment. This information is not intended to replace advice given to you by your health care provider. Make sure you discuss any questions you have with your health care provider. Document Revised: 07/19/2019 Document Reviewed: 07/19/2019 Elsevier Patient Education  2022 [employer].

## 2021-11-05 NOTE — Progress Notes (Signed)
I,Tianna Badgett,acting as a Education administrator for [employer], FNP.,have documented all relevant documentation on the behalf of [name], FNP,as directed by  [name], FNP while in the presence of [name], [place].  This visit occurred during the SARS-CoV-2 public health emergency.  Safety protocols were in place, including screening questions prior to the visit, additional usage of staff PPE, and extensive cleaning of exam room while observing appropriate contact time as indicated for disinfecting solutions.  Subjective:     Patient ID: [name] , male    DOB: 1951/03/22 , 71 y.o.   MRN: [id number]   Chief Complaint  Patient presents with   Hypertension    HPI  Patient presents today for a blood pressure f/u.  He is taking Zantac and claritin for his allergies. He is having eye itching, rhinitis. He has been dealing with his wife and going to the cancer center, she has been going every day for the last few week - for radiation.   Eye Problem  The left eye is affected. This is a new (began on Wednesday) problem. The current episode started in the past 7 days. The problem occurs constantly. There is No known exposure to pink eye. He Does not wear contacts. Associated symptoms include an eye discharge and itching. He has tried eye drops (he has been using a cool compress that has helped and has been using visine to help with the itching) for the symptoms.  Hypertension This is a chronic problem. The current episode started more than 1 year ago. The problem has been gradually improving. Pertinent negatives include no chest pain or headaches. He has tried diuretics and angiotensin blockers for the symptoms.    Past Medical History:  Diagnosis Date   Hypertension      Family History  Problem Relation Age of Onset   Dementia Father    Colon cancer Neg Hx    Colon polyps Neg Hx    Esophageal cancer Neg Hx    Rectal cancer Neg Hx    Stomach cancer Neg Hx      Current  Outpatient Medications:    atorvastatin (LIPITOR) 10 MG tablet, TAKE 1 TABLET BY MOUTH EVERY DAY, Disp: 90 tablet, Rfl: 3   azelastine (OPTIVAR) 0.05 % ophthalmic solution, Place 1 drop into both eyes 2 (two) times daily., Disp: 6 mL, Rfl: 5   [facility] MELATONIN 3 MG TABS tablet, TAKE 1 TABLET BY MOUTH AT BEDTIME., Disp: 90 tablet, Rfl: 1   erythromycin ophthalmic ointment, Place 1 application into the left eye at bedtime., Disp: 3.5 g, Rfl: 0   fluticasone (FLONASE) 50 MCG/ACT nasal spray, Place 2 sprays into both nostrils daily., Disp: 16 g, Rfl: 2   losartan (COZAAR) 100 MG tablet, TAKE 1 TABLET BY MOUTH EVERY DAY, Disp: 90 tablet, Rfl: 1   melatonin 10 MG TABS, Take 10 mg by mouth at bedtime., Disp: 30 tablet, Rfl: 1   No Known Allergies   Review of Systems  Constitutional: Negative.   Eyes:  Positive for discharge and itching.  Respiratory: Negative.  Negative for shortness of breath.   Cardiovascular: Negative.  Negative for chest pain and palpitations.  Gastrointestinal: Negative.   Neurological: Negative.  Negative for headaches.  Psychiatric/Behavioral: Negative.      Today's Vitals   11/05/21 1411 11/05/21 1435  BP: (!) 148/84 (!) 142/82  Pulse: 84   Temp: 98.6 F (37 C)   TempSrc: Oral   Weight: 221 lb 3.2 oz (100.3 kg)  Height: 5' 4.6" (1.641 m)    Body mass index is 37.27 kg/m.   Objective:  Physical Exam Vitals reviewed.  Constitutional:      General: He is not in acute distress.    Appearance: Normal appearance.  HENT:     Head: Normocephalic.  Eyes:     Pupils: Pupils are equal, round, and reactive to light.     Comments: Conjunctivae is slightly reddened to left ey also has lagging upper eyelid.   Cardiovascular:     Rate and Rhythm: Normal rate and regular rhythm.     Pulses: Normal pulses.     Heart sounds: Normal heart sounds. No murmur heard. Pulmonary:     Effort: Pulmonary effort is normal. No respiratory distress.     Breath sounds: Normal breath  sounds. No wheezing.  Neurological:     General: No focal deficit present.     Mental Status: He is alert and oriented to person, place, and time.     Cranial Nerves: No cranial nerve deficit.     Motor: No weakness.  Psychiatric:        Mood and Affect: Mood normal.        Behavior: Behavior normal.        Thought Content: Thought content normal.        Judgment: Judgment normal.        Assessment And Plan:     1. Eye irritation Comments: left eye with erythema to conjunctivae and has a slightly lagging upper lid.  - erythromycin ophthalmic ointment; Place 1 application into the left eye at bedtime.  Dispense: 3.5 g; Refill: 0  2. Essential hypertension Comments: Blood pressure is elevated he is missing doses due to caring for his wife who has been diagnosed with breast cancer. I have discussed with him the importance of taking his blood pressure medications as directed. He has not taken his medications today - BMP8+EGFR  3. Dyslipidemia Comments: Tolerating statin well, continue current medications - Lipid panel  4. Insomnia, unspecified type Comments: Overall doing well.   5. Seasonal allergic conjunctivitis Comments: Rx for flonase sent to pharmacy and he can get [facility] eye drops - fluticasone (FLONASE) 50 MCG/ACT nasal spray; Place 2 sprays into both nostrils daily.  Dispense: 16 g; Refill: 2  6. Class 2 obesity due to excess calories without serious comorbidity with body mass index (BMI) of 37.0 to 37.9 in adult Comments: Encouraged to increase physical activity She is encouraged to strive for BMI less than 30 to decrease cardiac risk. Advised to aim for at least 150 minutes of exercise per week.   Patient was given opportunity to ask questions. Patient verbalized understanding of the plan and was able to repeat key elements of the plan. All questions were answered to their satisfaction.  [name], FNP   I, [name], FNP, have reviewed all documentation for  this visit. The documentation on 11/05/21 for the exam, diagnosis, procedures, and orders are all accurate and complete.   IF YOU HAVE BEEN REFERRED TO A SPECIALIST, IT MAY TAKE 1-2 WEEKS TO SCHEDULE/PROCESS THE REFERRAL. IF YOU HAVE NOT HEARD FROM US/SPECIALIST IN TWO WEEKS, PLEASE GIVE [place] A CALL AT [phone].   THE PATIENT IS ENCOURAGED TO PRACTICE SOCIAL DISTANCING DUE TO THE COVID-19 PANDEMIC.

## 2021-11-06 ENCOUNTER — Telehealth: Payer: Self-pay | Admitting: *Deleted

## 2021-11-06 LAB — BMP8+EGFR
BUN/Creatinine Ratio: 16 (ref 10–24)
BUN: 18 mg/dL (ref 8–27)
CO2: 26 mmol/L (ref 20–29)
Calcium: 9.4 mg/dL (ref 8.6–10.2)
Chloride: 102 mmol/L (ref 96–106)
Creatinine, Ser: 1.16 mg/dL (ref 0.76–1.27)
Glucose: 107 mg/dL — ABNORMAL HIGH (ref 70–99)
Potassium: 4.6 mmol/L (ref 3.5–5.2)
Sodium: 142 mmol/L (ref 134–144)
eGFR: 67 mL/min/{1.73_m2} (ref >59)

## 2021-11-06 LAB — LIPID PANEL
Chol/HDL Ratio: 5.5 ratio — ABNORMAL HIGH (ref 0.0–5.0)
Cholesterol, Total: 209 mg/dL — ABNORMAL HIGH (ref 100–199)
HDL: 38 mg/dL — ABNORMAL LOW (ref >39)
LDL Chol Calc (NIH): 129 mg/dL — ABNORMAL HIGH (ref 0–99)
Triglycerides: 235 mg/dL — ABNORMAL HIGH (ref 0–149)
VLDL Cholesterol Cal: 42 mg/dL — ABNORMAL HIGH (ref 5–40)

## 2021-11-06 NOTE — Chronic Care Management (AMB) (Signed)
°  Chronic Care Management   Outreach Note  11/06/2021 Name: [name] MRN: [id number] DOB: 11-25-1950  [name] is a 71 y.o. year old male who is a primary care patient of [name], [place]. I reached out to Luan Pulling by phone today in response to a referral sent by [name] primary care provider.  An unsuccessful telephone outreach was attempted today. The patient was referred to the case management team for assistance with care management and care coordination.   Follow Up Plan: A HIPAA compliant phone message was left for the patient providing contact information and requesting a return call.  The care management team will reach out to the patient again over the next 5 days.  If patient returns call to provider office, please advise to call [place]* at [phone].*  [facility]  Direct Dial: [phone]

## 2021-11-09 NOTE — Chronic Care Management (AMB) (Signed)
?  Chronic Care Management  ? ?Outreach Note ? ?11/09/2021 ?Name: [name] MRN: [id number] DOB: 08/28/1951 ? ?[name] is a 71 y.o. year old male who is a primary care patient of [name], [place]. I reached out to Luan Pulling by phone today in response to a referral sent by Mr. EPHREM CARRICK primary care provider. ? ?A second unsuccessful telephone outreach was attempted today. The patient was referred to the case management team for assistance with care management and care coordination.  ? ?Follow Up Plan: A HIPAA compliant phone message was left for the patient providing contact information and requesting a return call.  ?The care management team will reach out to the patient again over the next 7 days.  ?If patient returns call to provider office, please advise to call [place]* at [phone].* ? ?Laverda Sorenson  ?Care Guide, Embedded Care Coordination ?[place]  Care Management  ?Direct Dial: [phone] ? ?

## 2021-11-13 NOTE — Chronic Care Management (AMB) (Signed)
?  Chronic Care Management  ? ?Note ? ?11/13/2021 ?Name: [name] MRN: [id number] DOB: Nov 01, 1950 ? ?[name] is a 71 y.o. year old male who is a primary care patient of [name], [place]. I reached out to [name] by phone today in response to a referral sent by [name] PCP. ? ?[name] was given information about Chronic Care Management services today including:  ?CCM service includes personalized support from designated clinical staff supervised by his physician, including individualized plan of care and coordination with other care providers ?24/7 contact phone numbers for assistance for urgent and routine care needs. ?Service will only be billed when office clinical staff spend 20 minutes or more in a month to coordinate care. ?Only one practitioner may furnish and bill the service in a calendar month. ?The patient may stop CCM services at any time (effective at the end of the month) by phone call to the office staff. ?The patient is responsible for co-pay (up to 20% after annual deductible is met) if co-pay is required by the individual health plan.  ? ?Patient agreed to services and verbal consent obtained.  ? ?Follow up plan: ?Telephone appointment with care management team member scheduled for: ?Chronic Care Management  ? ?Note ? ?11/13/2021 ?Name: [name] MRN: [id number] DOB: 11-Apr-1951 ? ?[name] is a 71 y.o. year old male who is a primary care patient of [name], [place]. I reached out to [name] by phone today in response to a referral sent by [name] PCP. ? ?[name] was given information about Chronic Care Management services today including:  ?CCM service includes personalized support from designated clinical staff supervised by his physician, including individualized plan of care and coordination with other care providers ?24/7 contact phone numbers for assistance for urgent and routine care needs. ?Service will  only be billed when office clinical staff spend 20 minutes or more in a month to coordinate care. ?Only one practitioner may furnish and bill the service in a calendar month. ?The patient may stop CCM services at any time (effective at the end of the month) by phone call to the office staff. ?The patient is responsible for co-pay (up to 20% after annual deductible is met) if co-pay is required by the individual health plan.  ? ?Patient agreed to services and verbal consent obtained.  ? ?Follow up plan: ?Telephone appointment with care management team member scheduled for:11/21/21 ? ? ?[name]  ?Care Guide, Embedded Care Coordination ?[place]  Care Management  ?Direct Dial: [phone] ? ?

## 2021-11-16 ENCOUNTER — Telehealth: Payer: Self-pay

## 2021-11-16 NOTE — Chronic Care Management (AMB) (Unsigned)
° ° °  Chronic Care Management Pharmacy Assistant   Name: [name]  MRN: [id number] DOB: 02-07-1951  Reason for Encounter: Chart review for CPP visit on 11-21-2021.   Conditions to be addressed/monitored: HTN and HLD  Recent office visits:  11-05-2021 [name], [place]. Glucose= 107. Cholesterol= 209, Trig= 235, HDL= 38, VLDL= 42, LDL= 129, Chol/HDL= 5.5. START Optivar 0.05% 1 drop into both eyes twice daily, Erythromycin Place 1 application into the left eye at bedtime and Flonase 2 sprays into both nostrils daily.   05-28-2021 [name], [place]. INCREASE Melatonin 3 mg at bedtime TO 10 mg at bedtime. Flu vaccine given.  Recent consult visits:  None  Hospital visits:  None in previous 6 months  Medications: Outpatient Encounter Medications as of 11/16/2021  Medication Sig   atorvastatin (LIPITOR) 10 MG tablet TAKE 1 TABLET BY MOUTH EVERY DAY   azelastine (OPTIVAR) 0.05 % ophthalmic solution Place 1 drop into both eyes 2 (two) times daily.   [facility] MELATONIN 3 MG TABS tablet TAKE 1 TABLET BY MOUTH AT BEDTIME.   erythromycin ophthalmic ointment Place 1 application into the left eye at bedtime.   fluticasone (FLONASE) 50 MCG/ACT nasal spray Place 2 sprays into both nostrils daily.   losartan (COZAAR) 100 MG tablet TAKE 1 TABLET BY MOUTH EVERY DAY   melatonin 10 MG TABS Take 10 mg by mouth at bedtime.   No facility-administered encounter medications on file as of 11/16/2021.   Lab Results  Component Value Date/Time   HGBA1C 5.1 08/21/2020 02:53 PM   HGBA1C 4.8 08/04/2019 10:13 AM   MICROALBUR 150 08/21/2020 12:57 PM     BP Readings from Last 3 Encounters:  11/05/21 (!) 142/82  05/28/21 136/80  04/09/21 132/80      Have you seen any other providers since your last visit with PCP? {yes/no:20286}  What is your top health concern to discuss at your upcoming visit?   Do you have any problems getting your medications from the pharmacy? {yes/no:20286}  Financial  barriers? {yes/no:20286} Access barriers? {yes/no:20286}   If yes, please explain:   [name] was reminded to have all medications, supplements and any blood glucose and/or blood pressure readings available for review with [name], Pharm. D, at his {telephone/office:25179} visit on *** at *** .   11-16-2021: 1st attempt left VM  Star Rating Drugs:  Losartan 100 mg- Last filled 11-11-2021 [facility] Atorvastatin 10 mg- Last filled 08-20-2021 [facility]  Care Gaps: Last annual wellness visit? None Last Colonoscopy? 10-06-2019 Last Mammogram? Last [place]?  *if applicable: Last eye exam / retinopathy screening? Last diabetic foot exam?  Immunizations: Covid booster overdue  [place] Clinical Pharmacist Assistant [phone]

## 2021-11-21 ENCOUNTER — Other Ambulatory Visit: Payer: Self-pay | Admitting: Nurse Practitioner

## 2021-11-21 ENCOUNTER — Ambulatory Visit (INDEPENDENT_AMBULATORY_CARE_PROVIDER_SITE_OTHER): Payer: Medicare Other

## 2021-11-21 DIAGNOSIS — E785 Hyperlipidemia, unspecified: Secondary | ICD-10-CM

## 2021-11-21 DIAGNOSIS — I10 Essential (primary) hypertension: Secondary | ICD-10-CM

## 2021-11-21 MED ORDER — ATORVASTATIN CALCIUM 20 MG PO TABS: 20.0000 mg | ORAL_TABLET | Freq: Every day | ORAL | 0 refills | Status: DC

## 2021-11-21 MED ORDER — HYDROCHLOROTHIAZIDE 12.5 MG PO TABS
12.5000 mg | ORAL_TABLET | Freq: Every day | ORAL | 1 refills | Status: DC
Start: 2021-11-21 — End: 2021-12-10

## 2021-11-21 NOTE — Patient Instructions (Addendum)
Visit Information ?It was great speaking with you today!  Please let me know if you have any questions about our visit. ? ? Goals Addressed   ? ?  ?  ?  ?  ? This Visit's Progress  ?  Manage My Medicine     ?  Timeframe:  Long-Range Goal ?Priority:  High ?Start Date:                             ?Expected End Date:                      ? ?Follow Up Date 01/19/2022  ?  ?- keep a list of all the medicines I take; vitamins and herbals too ?- use an alarm clock or phone to remind me to take my medicine  ?  ?Why is this important?   ?These steps will help you keep on track with your medicines. ?  ? ?  ?  Track and Manage My Blood Pressure-Hypertension     ?  Timeframe:  Long-Range Goal ?Priority:  High ?Start Date:                             ?Expected End Date:                      ? ?Follow Up Date 01/22/2022  ?  ?- check blood pressure 3 times per week ?- choose a place to take my blood pressure (home, clinic or office, retail store) ?- write blood pressure results in a log or diary  ?  ?Why is this important?   ?You won't feel high blood pressure, but it can still hurt your blood vessels.  ?High blood pressure can cause heart or kidney problems. It can also cause a stroke.  ?Making lifestyle changes like losing a little weight or eating less salt will help.  ?Checking your blood pressure at home and at different times of the day can help to control blood pressure.  ?If the doctor prescribes medicine remember to take it the way the doctor ordered.  ?Call the office if you cannot afford the medicine or if there are questions about it.   ?  ?Notes:  ?-Please let us know if you have any questions  ?  ? ?  ? ? ?Patient Care Plan: [name]  ?  ? ?Problem Identified: HTN, HLD   ?  ? ?Long-Range Goal: Patient Stated   ?This Visit's Progress: On track  ?Priority: High  ?Note:   ?Current Barriers:  ?Unable to independently monitor therapeutic efficacy ?  ?Pharmacist Clinical Goal(s):  ?Patient will achieve  adherence to monitoring guidelines and medication adherence to achieve therapeutic efficacy through collaboration with PharmD and provider.  ?  ?Interventions: ?1:1 collaboration with Jose Brine, FNP regarding development and update of comprehensive plan of care as evidenced by provider attestation and co-signature ?Inter-disciplinary care team collaboration (see longitudinal plan of care) ?Comprehensive medication review performed; medication list updated in electronic medical record ?  ?Hypertension (BP goal <130/80) ?-Uncontrolled ?-Current treatment: ?Losartan 100 mg tablet once per day Appropriate, Query effective,  ?  ?-Medications previously tried: has not previously taken any BP medications in the past   ?-Current home readings: he is checking his BP at home, usually 150/70- checked prior to taking medication  ?            -  He checks his BP twice per week  ?-Current dietary habits: He is getting his from [facility] - and he eats one plate, and then a sandwich that afternoon. He sometimes has a [place] sandwich or tuna.  ?-Current exercise habits: He is not exercising at this time, while he is taking care of his wife, he reports that exercising and doing things can be a challenge because he has to take care of his wife, and that can be a challenge.  ?-He has been offered help to take care of his wife but he does not need that.  ?-Denies hypotensive/hypertensive symptoms ?-Educated on Daily salt intake goal < 2300 mg; ?Importance of home blood pressure monitoring; ?Proper BP monitoring technique; ?-Counseled to monitor BP at home three time per week, document, and provide log at future appointments ?-Assistant to call patient every other week to see what patients BP readings are  ?-Collaborated with PCP team to start patient on hydrochlorothiazide 12.5 mg tablet once per day.   ?  ?Hyperlipidemia: (LDL goal < 70) ?-Uncontrolled ?-Current treatment: ?Atorvastatin 10 mg tablet once per day Appropriate,  Query effective ?  ?He is taking his medication in the morning when he first gets up  ?-Current dietary patterns: Broiled fish and grilled chicken. He purchases the meals from [employer]. He usually gets both with no seasoning  ?-Current exercise habits: not currently exercising  ?-Educated on Cholesterol goals;  ?Benefits of statin for ASCVD risk reduction; ?-Collaborated with patient and PCP to increase Atorvastatin from 10 mg to 20 mg tablet once per day ?-Recommended patient increase the amount of vegetables he is eating by adding a double helping of vegetables to his meal.  ?-Patient to have lab work completed in 6 weeks to include lipid panel and CBC and chemistry  ?  ?Health Maintenance ?-Vaccine gaps: COVID-19 Booster - to be scheduled, would prefer a call around 11  ?-Current therapy:  ?Melatonin 10 mg tablet once per day  ?-Educated on Herbal supplement research is limited and benefits usually cannot be proven ?-Patient is satisfied with current therapy and denies issues ?-Recommended to continue current medication ?  ?Patient Goals/Self-Care Activities ?Patient will:  ?- take medications as prescribed as evidenced by patient report and record review ?  ?Follow Up Plan: The patient has been provided with contact information for the care management team and has been advised to call with any health related questions or concerns.  ?  ? ?Recommendations/Changes made from today's visit: ?Patient to be started on Hydrochlorothiazide 12.5 mg tablet daily ?Atorvastatin 10 mg tablet to be increased ?Check BP at least three times per week ?Patient to use delivery based medication management system  ?COVID-19 Booster appointment  ? ?Plan: ?Ordered HCTZ 12.5 mg tablet once per day  ?Ordered Atorvastatin 20 mg tablet daily, d/c'ed Atorvastatin 10 mg - made pharmacy aware ?HC to call patient every other for HTN assessment  ?HC to onboard patient to pharmacy and [name], [name] to be enrolled into  pharmacy  ?COVID-19 Booster appointment to be made ? ?[name] was given information about Chronic Care Management services today including:  ?CCM service includes personalized support from designated clinical staff supervised by his physician, including individualized plan of care and coordination with other care providers ?24/7 contact phone numbers for assistance for urgent and routine care needs. ?Standard insurance, coinsurance, copays and deductibles apply for chronic care management only during months in which we provide at least 20 minutes of these services.  Most insurances cover these services at 100%, however patients may be responsible for any copay, coinsurance and/or deductible if applicable. This service may help you avoid the need for more expensive face-to-face services. ?Only one practitioner may furnish and bill the service in a calendar month. ?The patient may stop CCM services at any time (effective at the end of the month) by phone call to the office staff. ? ?Patient agreed to services and verbal consent obtained.  ? ?The patient verbalized understanding of instructions, educational materials, and care plan provided today and agreed to receive a mailed copy of patient instructions, educational materials, and care plan.  ? ?[name], PharmD ?Clinical Pharmacist Practitioner  ?[facility] ?[phone] ?  ?

## 2021-11-21 NOTE — Progress Notes (Signed)
? ? ? ?Chronic Care Management ?Pharmacy Note ? ?11/21/2021 ?Name:  [name] MRN:  [id number] DOB:  Jul 03, 1951 ? ?Summary: ?Patient reports that he has been doing well. He is taking care of his wife full time and she has completed radiation therapy.  ? ?Recommendations/Changes made from today's visit: ?Patient to be started on Hydrochlorothiazide 12.5 mg tablet daily ?Atorvastatin 10 mg tablet to be increased ?Check BP at least three times per week ?Patient to use delivery based medication management system  ?COVID-19 Booster appointment  ? ?Plan: ?Ordered HCTZ 12.5 mg tablet once per day  ?Ordered Atorvastatin 20 mg tablet daily, d/c'ed Atorvastatin 10 mg - made pharmacy aware ?HC to call patient every other for HTN assessment  ?HC to onboard patient to pharmacy and Ms. Seith, spouse Ms. Lunney to be enrolled into pharmacy  ?COVID-19 Booster appointment to be made ? ? ?Subjective: ?[name] is an 71 y.o. year old male who is a primary patient of [name], [place].  The CCM team was consulted for assistance with disease management and care coordination needs.   ? ?Engaged with patient by telephone for initial visit in response to provider referral for pharmacy case management and/or care coordination services. Patient has been retired for 1 year. Ms. Fitton his wife was diagnosed with [facility] and just finished radiation. She went through radiation for 6 weeks and so far she does not. He is at the cancer center every morning at 9:45 am, they leave around 10:15. She did well with the surgery and radiation. His wife is also experiencing memory loss so he is dealing with that as well. They put everything on hold until after Friday, the last radiation treatment and now they can get back to normalcy. Her next appointment is on 12/28/2021 at the cancer center. ? ?Consent to Services:  ?The patient was given the following information about Chronic Care Management services today, agreed to services, and gave  verbal consent: 1. CCM service includes personalized support from designated clinical staff supervised by the primary care provider, including individualized plan of care and coordination with other care providers 2. 24/7 contact phone numbers for assistance for urgent and routine care needs. 3. Service will only be billed when office clinical staff spend 20 minutes or more in a month to coordinate care. 4. Only one practitioner may furnish and bill the service in a calendar month. 5.The patient may stop CCM services at any time (effective at the end of the month) by phone call to the office staff. 6. The patient will be responsible for cost sharing (co-pay) of up to 20% of the service fee (after annual deductible is met). Patient agreed to services and consent obtained. ? ?Patient Care Team: ?[name], FNP as PCP - General (General Practice) ?[name], RPH (Pharmacist) ? ?11-05-2021 [name], [place]. Glucose= 107. Cholesterol= 209, Trig= 235, HDL= 38, VLDL= 42, LDL= 129, Chol/HDL= 5.5. START Optivar 0.05% 1 drop into both eyes twice daily, Erythromycin Place 1 application into the left eye at bedtime and Flonase 2 sprays into both nostrils daily.  ?  ?05-28-2021 [name], [place]. INCREASE Melatonin 3 mg at bedtime TO 10 mg at bedtime. Flu vaccine given. ?  ?Recent consult visits:  ?None ?  ?Hospital visits:  ?None in previous 6 months ? ? ?Objective: ? ?Lab Results  ?Component Value Date  ? CREATININE 1.16 11/05/2021  ? BUN 18 11/05/2021  ? EGFR 67 11/05/2021  ? GFRNONAA 63 08/21/2020  ?  GFRAA 73 08/21/2020  ? NA 142 11/05/2021  ? K 4.6 11/05/2021  ? CALCIUM 9.4 11/05/2021  ? CO2 26 11/05/2021  ? GLUCOSE 107 (H) 11/05/2021  ? ? ?Lab Results  ?Component Value Date/Time  ? HGBA1C 5.1 08/21/2020 02:53 PM  ? HGBA1C 4.8 08/04/2019 10:13 AM  ? MICROALBUR 150 08/21/2020 12:57 PM  ?  ? ?Lab Results  ?Component Value Date  ? CHOL 209 (H) 11/05/2021  ? HDL 38 (L) 11/05/2021  ? LDLCALC 129 (H) 11/05/2021  ?  TRIG 235 (H) 11/05/2021  ? CHOLHDL 5.5 (H) 11/05/2021  ? ? ?Hepatic Function Latest Ref Rng & Units 11/14/2020 08/21/2020 08/04/2019  ?Total Protein 6.0 - 8.5 g/dL 7.2 7.0 6.7  ?Albumin 3.8 - 4.8 g/dL 4.3 4.0 4.2  ?AST 0 - 40 IU/L _0 ?ALT 0 - 44 IU/L _1 ?Alk Phosphatase 44 - 121 IU/L 78 78 79  ?Total Bilirubin 0.0 - 1.2 mg/dL 0.5 0.6 0.5  ? ? ?Lab Results  ?Component Value Date/Time  ? TSH 1.660 08/04/2019 10:13 AM  ? ? ?CBC Latest Ref Rng & Units 08/21/2020 04/24/2009 01/22/2008  ?WBC 3.4 - 10.8 x10E3/uL 5.1 - 4.7  ?Hemoglobin 13.0 - 17.7 g/dL 15.8 17.0 11.9(L)  ?Hematocrit 37.5 - 51.0 % 46.9 - 35.0(L)  ?Platelets 150 - 450 x10E3/uL 148(L) - 179  ? ?Clinical ASCVD: No  ?The 10-year ASCVD risk score (Arnett DK, et al., 2019) is: 25.4% ?  Values used to calculate the score: ?    Age: 71 years ?    Sex: Male ?    Is Non-Hispanic African American: Yes ?    Diabetic: No ?    Tobacco smoker: No ?    Systolic Blood Pressure: 914 mmHg ?    Is BP treated: Yes ?    HDL Cholesterol: 38 mg/dL ?    Total Cholesterol: 209 mg/dL   ? ?Depression screen [facility] 2/9 11/05/2021 08/21/2020 08/04/2019  ?Decreased Interest 0 0 0  ?Down, Depressed, Hopeless 0 0 0  ?PHQ - 2 Score 0 0 0  ?Altered sleeping 0 - -  ?Tired, decreased energy 0 - -  ?Change in appetite 0 - -  ?Feeling bad or failure about yourself  0 - -  ?Trouble concentrating 0 - -  ?Moving slowly or fidgety/restless 0 - -  ?Suicidal thoughts 0 - -  ?PHQ-9 Score 0 - -  ?  ? ?Social History  ? ?Tobacco Use  ?Smoking Status Never  ?Smokeless Tobacco Never  ? ?BP Readings from Last 3 Encounters:  ?11/05/21 (!) 142/82  ?05/28/21 136/80  ?04/09/21 132/80  ? ?Pulse Readings from Last 3 Encounters:  ?11/05/21 84  ?05/28/21 74  ?04/09/21 84  ? ?Wt Readings from Last 3 Encounters:  ?11/05/21 221 lb 3.2 oz (100.3 kg)  ?05/28/21 219 lb 9.6 oz (99.6 kg)  ?04/09/21 222 lb 6.4 oz (100.9 kg)  ? ?BMI Readings from Last 3 Encounters:  ?11/05/21 37.27 kg/m?  ?05/28/21 37.00 kg/m?   ?04/09/21 37.47 kg/m?  ? ? ?Assessment/Interventions: Review of patient past medical history, allergies, medications, health status, including review of consultants reports, laboratory and other test data, was performed as part of comprehensive evaluation and provision of chronic care management services.  ? ?SDOH:  (Social Determinants of Health) assessments and interventions performed: Yes ? ?SDOH Screenings  ? ?Alcohol Screen: Not on file  ?Depression (PHQ2-9): Low Risk   ? PHQ-2 Score: 0  ?Financial Resource Strain: Low Risk   ?  Difficulty of Paying Living Expenses: Not hard at all  ?Food Insecurity: Not on file  ?Housing: Not on file  ?Physical Activity: Not on file  ?Social Connections: Not on file  ?Stress: Not on file  ?Tobacco Use: Low Risk   ? Smoking Tobacco Use: Never  ? Smokeless Tobacco Use: Never  ? Passive Exposure: Not on file  ?Transportation Needs: No Transportation Needs  ? Lack of Transportation (Medical): No  ? Lack of Transportation (Non-Medical): No  ? ? ?CCM Care Plan ? ?No Known Allergies ? ?Medications Reviewed Today   ? ? Reviewed by [name], RPH (Pharmacist) on 11/21/21 at 1156  Med List Status: <None>  ? ?Medication Order Taking? Sig Documenting Provider Last Dose Status Informant  ?atorvastatin (LIPITOR) 10 MG tablet [id number] No TAKE 1 TABLET BY MOUTH EVERY DAY [name], FNP Taking Active   ?azelastine (OPTIVAR) 0.05 % ophthalmic solution [id number]  Place 1 drop into both eyes 2 (two) times daily. [name], FNP  Active   ?[facility] MELATONIN 3 MG TABS tablet [id number] No TAKE 1 TABLET BY MOUTH AT BEDTIME. [name], FNP Taking Active   ?erythromycin ophthalmic ointment [id number]  Place 1 application into the left eye at bedtime. [name], FNP  Active   ?fluticasone [facility]) 50 MCG/ACT nasal spray [id number]  Place 2 sprays into both nostrils daily. [name], FNP  Active   ?losartan (COZAAR) 100 MG tablet [id number] No TAKE 1 TABLET BY MOUTH EVERY DAY [name], FNP Taking Active   ?melatonin 10 MG TABS [id number] No Take 10 mg by mouth at bedtime. [name], FNP Taking Active   ? ?  ?  ? ?  ? ? ?Patient Active Problem List  ? Diagnosis Date Noted  ? [facility]

## 2021-11-23 ENCOUNTER — Telehealth: Payer: Self-pay

## 2021-11-23 NOTE — Chronic Care Management (AMB) (Signed)
? ? ?  Chronic Care Management ?Pharmacy Assistant  ? ?Name: [name]  MRN: [id number] DOB: [birth year] ? ?Reason for Encounter: Onboard ?  ?Medications: ?Outpatient Encounter Medications as of 11/23/2021  ?Medication Sig  ? atorvastatin (LIPITOR) 20 MG tablet Take 1 tablet (20 mg total) by mouth daily.  ? azelastine (OPTIVAR) 0.05 % ophthalmic solution Place 1 drop into both eyes 2 (two) times daily.  ? [facility] MELATONIN 3 MG TABS tablet TAKE 1 TABLET BY MOUTH AT BEDTIME.  ? erythromycin ophthalmic ointment Place 1 application into the left eye at bedtime.  ? fluticasone (FLONASE) 50 MCG/ACT nasal spray Place 2 sprays into both nostrils daily.  ? hydrochlorothiazide (HYDRODIURIL) 12.5 MG tablet Take 1 tablet (12.5 mg total) by mouth daily.  ? losartan (COZAAR) 100 MG tablet TAKE 1 TABLET BY MOUTH EVERY DAY  ? melatonin 10 MG TABS Take 10 mg by mouth at bedtime.  ? ?No facility-administered encounter medications on file as of 11/23/2021.  ? ? ?11-23-2021: Patient had initial visit with CPP on 11-21-2021 and would like to onboard with [facility]. Completed benefits research and onboarding form. Left patient a VM to call for packaging preference. ? ?11-26-2021: Left VM ? ?11-29-2021: 3rd attempt left VM ? ?[name] CMA ?Clinical Pharmacist Assistant ?[phone] ? ?

## 2021-11-27 ENCOUNTER — Encounter: Payer: Self-pay | Admitting: Nurse Practitioner

## 2021-11-27 LAB — HM DIABETES EYE EXAM

## 2021-11-29 ENCOUNTER — Ambulatory Visit: Payer: Medicare Other | Admitting: Nurse Practitioner

## 2021-12-07 DIAGNOSIS — E785 Hyperlipidemia, unspecified: Secondary | ICD-10-CM | POA: Diagnosis not present

## 2021-12-07 DIAGNOSIS — I10 Essential (primary) hypertension: Secondary | ICD-10-CM | POA: Diagnosis not present

## 2021-12-10 ENCOUNTER — Other Ambulatory Visit: Payer: Self-pay

## 2021-12-10 DIAGNOSIS — I10 Essential (primary) hypertension: Secondary | ICD-10-CM

## 2021-12-10 MED ORDER — HYDROCHLOROTHIAZIDE 12.5 MG PO TABS: 12.5000 mg | ORAL_TABLET | Freq: Every day | ORAL | 2 refills | Status: DC

## 2021-12-10 MED ORDER — LOSARTAN POTASSIUM 100 MG PO TABS: 100.0000 mg | ORAL_TABLET | Freq: Every day | ORAL | 2 refills | Status: DC

## 2021-12-10 MED ORDER — ATORVASTATIN CALCIUM 20 MG PO TABS: 20.0000 mg | ORAL_TABLET | Freq: Every day | ORAL | 2 refills | Status: DC

## 2021-12-16 ENCOUNTER — Other Ambulatory Visit: Payer: Self-pay | Admitting: Nurse Practitioner

## 2022-01-18 ENCOUNTER — Ambulatory Visit (INDEPENDENT_AMBULATORY_CARE_PROVIDER_SITE_OTHER): Payer: Medicare Other

## 2022-01-18 ENCOUNTER — Telehealth: Payer: Self-pay

## 2022-01-18 VITALS — Ht 67.0 in | Wt 215.0 lb

## 2022-01-18 DIAGNOSIS — Z0000 Encounter for general adult medical examination without abnormal findings: Secondary | ICD-10-CM | POA: Diagnosis not present

## 2022-01-18 NOTE — Patient Instructions (Signed)
Mr. Sime , ?Thank you for taking time to come for your Medicare Wellness Visit. I appreciate your ongoing commitment to your health goals. Please review the following plan we discussed and let me know if I can assist you in the future.  ? ?Screening recommendations/referrals: ?Colonoscopy: not required ?Recommended yearly ophthalmology/optometry visit for glaucoma screening and checkup ?Recommended yearly dental visit for hygiene and checkup ? ?Vaccinations: ?Influenza vaccine: due 04/09/2022 ?Pneumococcal vaccine: completed 06/01/2021 ?Tdap vaccine: completed 10/12/2019, due 10/11/2029 ?Shingles vaccine: completed    ?Covid-19: 03/16/2021, 09/15/2020, 02/08/2020, 01/14/2020 ? ?Advanced directives: Advance directive discussed with you today.  ? ?Conditions/risks identified: none ? ?Next appointment: Follow up in one year for your annual wellness visit.  ? ?Preventive Care 4 Years and Older, Male ?Preventive care refers to lifestyle choices and visits with your health care provider that can promote health and wellness. ?What does preventive care include? ?A yearly physical exam. This is also called an annual well check. ?Dental exams once or twice a year. ?Routine eye exams. Ask your health care provider how often you should have your eyes checked. ?Personal lifestyle choices, including: ?Daily care of your teeth and gums. ?Regular physical activity. ?Eating a healthy diet. ?Avoiding tobacco and drug use. ?Limiting alcohol use. ?Practicing safe sex. ?Taking low doses of aspirin every day. ?Taking vitamin and mineral supplements as recommended by your health care provider. ?What happens during an annual well check? ?The services and screenings done by your health care provider during your annual well check will depend on your age, overall health, lifestyle risk factors, and family history of disease. ?Counseling  ?Your health care provider may ask you questions about your: ?Alcohol use. ?Tobacco use. ?Drug use. ?Emotional  well-being. ?Home and relationship well-being. ?Sexual activity. ?Eating habits. ?History of falls. ?Memory and ability to understand (cognition). ?Work and work Statistician. ?Screening  ?You may have the following tests or measurements: ?Height, weight, and BMI. ?Blood pressure. ?Lipid and cholesterol levels. These may be checked every 5 years, or more frequently if you are over 49 years old. ?Skin check. ?Lung cancer screening. You may have this screening every year starting at age 9 if you have a 30-pack-year history of smoking and currently smoke or have quit within the past 15 years. ?Fecal occult blood test (FOBT) of the stool. You may have this test every year starting at age 51. ?Flexible sigmoidoscopy or colonoscopy. You may have a sigmoidoscopy every 5 years or a colonoscopy every 10 years starting at age 98. ?Prostate cancer screening. Recommendations will vary depending on your family history and other risks. ?Hepatitis C blood test. ?Hepatitis B blood test. ?Sexually transmitted disease (STD) testing. ?Diabetes screening. This is done by checking your blood sugar (glucose) after you have not eaten for a while (fasting). You may have this done every 1-3 years. ?Abdominal aortic aneurysm (AAA) screening. You may need this if you are a current or former smoker. ?Osteoporosis. You may be screened starting at age 53 if you are at high risk. ?Talk with your health care provider about your test results, treatment options, and if necessary, the need for more tests. ?Vaccines  ?Your health care provider may recommend certain vaccines, such as: ?Influenza vaccine. This is recommended every year. ?Tetanus, diphtheria, and acellular pertussis (Tdap, Td) vaccine. You may need a Td booster every 10 years. ?Zoster vaccine. You may need this after age 53. ?Pneumococcal 13-valent conjugate (PCV13) vaccine. One dose is recommended after age 19. ?Pneumococcal polysaccharide (PPSV23) vaccine. One dose is  recommended after  age 44. ?Talk to your health care provider about which screenings and vaccines you need and how often you need them. ?This information is not intended to replace advice given to you by your health care provider. Make sure you discuss any questions you have with your health care provider. ?Document Released: 09/22/2015 Document Revised: 05/15/2016 Document Reviewed: 06/27/2015 ?Elsevier Interactive Patient Education ? 2017 [place]. ? ?Fall Prevention in the Home ?Falls can cause injuries. They can happen to people of all ages. There are many things you can do to make your home safe and to help prevent falls. ?What can I do on the outside of my home? ?Regularly fix the edges of walkways and driveways and fix any cracks. ?Remove anything that might make you trip as you walk through a door, such as a raised step or threshold. ?Trim any bushes or trees on the path to your home. ?Use bright outdoor lighting. ?Clear any walking paths of anything that might make someone trip, such as rocks or tools. ?Regularly check to see if handrails are loose or broken. Make sure that both sides of any steps have handrails. ?Any raised decks and porches should have guardrails on the edges. ?Have any leaves, snow, or ice cleared regularly. ?Use sand or salt on walking paths during winter. ?Clean up any spills in your garage right away. This includes oil or grease spills. ?What can I do in the bathroom? ?Use night lights. ?Install grab bars by the toilet and in the tub and shower. Do not use towel bars as grab bars. ?Use non-skid mats or decals in the tub or shower. ?If you need to sit down in the shower, use a plastic, non-slip stool. ?Keep the floor dry. Clean up any water that spills on the floor as soon as it happens. ?Remove soap buildup in the tub or shower regularly. ?Attach bath mats securely with double-sided non-slip rug tape. ?Do not have throw rugs and other things on the floor that can make you trip. ?What can I do in the  bedroom? ?Use night lights. ?Make sure that you have a light by your bed that is easy to reach. ?Do not use any sheets or blankets that are too big for your bed. They should not hang down onto the floor. ?Have a firm chair that has side arms. You can use this for support while you get dressed. ?Do not have throw rugs and other things on the floor that can make you trip. ?What can I do in the kitchen? ?Clean up any spills right away. ?Avoid walking on wet floors. ?Keep items that you use a lot in easy-to-reach places. ?If you need to reach something above you, use a strong step stool that has a grab bar. ?Keep electrical cords out of the way. ?Do not use floor polish or wax that makes floors slippery. If you must use wax, use non-skid floor wax. ?Do not have throw rugs and other things on the floor that can make you trip. ?What can I do with my stairs? ?Do not leave any items on the stairs. ?Make sure that there are handrails on both sides of the stairs and use them. Fix handrails that are broken or loose. Make sure that handrails are as long as the stairways. ?Check any carpeting to make sure that it is firmly attached to the stairs. Fix any carpet that is loose or worn. ?Avoid having throw rugs at the top or bottom of the stairs. If  you do have throw rugs, attach them to the floor with carpet tape. ?Make sure that you have a light switch at the top of the stairs and the bottom of the stairs. If you do not have them, ask someone to add them for you. ?What else can I do to help prevent falls? ?Wear shoes that: ?Do not have high heels. ?Have rubber bottoms. ?Are comfortable and fit you well. ?Are closed at the toe. Do not wear sandals. ?If you use a stepladder: ?Make sure that it is fully opened. Do not climb a closed stepladder. ?Make sure that both sides of the stepladder are locked into place. ?Ask someone to hold it for you, if possible. ?Clearly mark and make sure that you can see: ?Any grab bars or  handrails. ?First and last steps. ?Where the edge of each step is. ?Use tools that help you move around (mobility aids) if they are needed. These include: ?Canes. ?Walkers. ?Scooters. ?Crutches. ?Turn on the lights whe

## 2022-01-18 NOTE — Progress Notes (Signed)
?I connected with Ernie Avena today by telephone and verified that I am speaking with the correct person using two identifiers. ?Location patient: home ?Location provider: work ?Persons participating in the virtual visit: Travin, Marik LPN. ?  ?I discussed the limitations, risks, security and privacy concerns of performing an evaluation and management service by telephone and the availability of in person appointments. I also discussed with the patient that there may be a patient responsible charge related to this service. The patient expressed understanding and verbally consented to this telephonic visit.  ?  ?Interactive audio and video telecommunications were attempted between this provider and patient, however failed, due to patient having technical difficulties OR patient did not have access to video capability.  We continued and completed visit with audio only. ? ?  ? ?Vital signs may be patient reported or missing. ? ?Subjective:  ? [name] is a 71 y.o. male who presents for an Initial Medicare Annual Wellness Visit. ? ?Review of Systems    ? ?Cardiac Risk Factors include: advanced age (>40mn, >>19women);hypertension;male gender;obesity (BMI >30kg/m2) ? ?   ?Objective:  ?  ?Today's Vitals  ? 01/18/22 0926  ?Weight: 215 lb (97.5 kg)  ?Height: '5\' 7"'$  (1.702 m)  ? ?Body mass index is 33.67 kg/m?. ? ? ?  01/18/2022  ?  9:33 AM  ?Advanced Directives  ?Does Patient Have a Medical Advance Directive? No  ? ? ?Current Medications (verified) ?Outpatient Encounter Medications as of 01/18/2022  ?Medication Sig  ? atorvastatin (LIPITOR) 20 MG tablet Take 1 tablet (20 mg total) by mouth daily.  ? [facility] MELATONIN 3 MG TABS tablet TAKE 1 TABLET BY MOUTH AT BEDTIME.  ? hydrochlorothiazide (HYDRODIURIL) 12.5 MG tablet Take 1 tablet (12.5 mg total) by mouth daily.  ? losartan (COZAAR) 100 MG tablet Take 1 tablet (100 mg total) by mouth daily.  ? azelastine (OPTIVAR) 0.05 % ophthalmic solution Place 1  drop into both eyes 2 (two) times daily. (Patient not taking: Reported on 01/18/2022)  ? erythromycin ophthalmic ointment Place 1 application into the left eye at bedtime. (Patient not taking: Reported on 01/18/2022)  ? fluticasone (FLONASE) 50 MCG/ACT nasal spray Place 2 sprays into both nostrils daily. (Patient not taking: Reported on 01/18/2022)  ? melatonin 10 MG TABS Take 10 mg by mouth at bedtime. (Patient not taking: Reported on 01/18/2022)  ? ?No facility-administered encounter medications on file as of 01/18/2022.  ? ? ?Allergies (verified) ?Patient has no known allergies.  ? ?History: ?Past Medical History:  ?Diagnosis Date  ? Hypertension   ? ?Past Surgical History:  ?Procedure Laterality Date  ? COLONOSCOPY  2005  ? COLONOSCOPY  10/06/2019  ? ?Family History  ?Problem Relation Age of Onset  ? Dementia Father   ? Colon cancer Neg Hx   ? Colon polyps Neg Hx   ? Esophageal cancer Neg Hx   ? Rectal cancer Neg Hx   ? Stomach cancer Neg Hx   ? ?Social History  ? ?Socioeconomic History  ? Marital status: Married  ?  Spouse name: Not on file  ? Number of children: Not on file  ? Years of education: Not on file  ? Highest education level: Not on file  ?Occupational History  ? Not on file  ?Tobacco Use  ? Smoking status: Never  ? Smokeless tobacco: Never  ?Vaping Use  ? Vaping Use: Never used  ?Substance and Sexual Activity  ? Alcohol use: Not Currently  ? Drug use:  Never  ? Sexual activity: Not on file  ?Other Topics Concern  ? Not on file  ?Social History Narrative  ? Not on file  ? ?Social Determinants of Health  ? ?Financial Resource Strain: Low Risk   ? Difficulty of Paying Living Expenses: Not hard at all  ?Food Insecurity: No Food Insecurity  ? Worried About Charity fundraiser in the Last Year: Never true  ? Ran Out of Food in the Last Year: Never true  ?Transportation Needs: No Transportation Needs  ? Lack of Transportation (Medical): No  ? Lack of Transportation (Non-Medical): No  ?Physical Activity:  Insufficiently Active  ? Days of Exercise per Week: 2 days  ? Minutes of Exercise per Session: 60 min  ?Stress: Stress Concern Present  ? Feeling of Stress : To some extent  ?Social Connections: Not on file  ? ? ?Tobacco Counseling ?Counseling given: Not Answered ? ? ?Clinical Intake: ? ?Pre-visit preparation completed: Yes ? ?Pain : No/denies pain ? ?  ? ?Nutritional Status: BMI > 30  Obese ?Nutritional Risks: None ?Diabetes: No ? ?How often do you need to have someone help you when you read instructions, pamphlets, or other written materials from your doctor or pharmacy?: 1 - Never ?What is the last grade level you completed in school?: 12th grade ? ?Diabetic? no ? ?Interpreter Needed?: No ? ?Information entered by :: NAllen LPN ? ? ?Activities of Daily Living ? ?  01/18/2022  ?  9:35 AM  ?In your present state of health, do you have any difficulty performing the following activities:  ?Hearing? 0  ?Vision? 0  ?Difficulty concentrating or making decisions? 0  ?Walking or climbing stairs? 0  ?Dressing or bathing? 0  ?Doing errands, shopping? 0  ?Preparing Food and eating ? N  ?Using the Toilet? N  ?In the past six months, have you accidently leaked urine? N  ?Do you have problems with loss of bowel control? N  ?Managing your Medications? N  ?Managing your Finances? N  ?Housekeeping or managing your Housekeeping? N  ? ? ?Patient Care Team: ?[name], FNP as PCP - General (General Practice) ?[name], RPH (Pharmacist) ? ?Indicate any recent Medical Services you may have received from other than [facility] providers in the past year (date may be approximate). ? ?   ?Assessment:  ? This is a routine wellness examination for [place]. ? ?Hearing/Vision screen ?Vision Screening - Comments:: Regular eye exams, [facility] ? ?Dietary issues and exercise activities discussed: ?Current Exercise Habits: Home exercise routine, Type of exercise: walking, Time (Minutes): 60, Frequency (Times/Week): 2, Weekly Exercise  (Minutes/Week): 120 ? ? Goals Addressed   ? ?  ?  ?  ?  ? This Visit's Progress  ?  Patient Stated     ?  01/18/2022, no goals ?  ? ?  ? ?Depression Screen ? ?  01/18/2022  ?  9:34 AM 11/05/2021  ?  2:10 PM 08/21/2020  ?  9:49 AM 08/04/2019  ?  9:13 AM  ?PHQ 2/9 Scores  ?PHQ - 2 Score 0 0 0 0  ?PHQ- 9 Score  0    ?  ?Fall Risk ? ?  01/18/2022  ?  9:34 AM 11/05/2021  ?  2:10 PM 08/21/2020  ?  9:48 AM 08/04/2019  ?  9:17 AM  ?Fall Risk   ?Falls in the past year? 0 0 0 0  ?Number falls in past yr: 0 0    ?Injury with Fall? 0 0    ?  Risk for fall due to : No Fall Risks     ?Follow up Falls evaluation completed;Education provided;Falls prevention discussed     ? ? ?FALL RISK PREVENTION PERTAINING TO THE HOME: ? ?Any stairs in or around the home? Yes  ?If so, are there any without handrails? Yes  ?Home free of loose throw rugs in walkways, pet beds, electrical cords, etc? Yes  ?Adequate lighting in your home to reduce risk of falls? Yes  ? ?ASSISTIVE DEVICES UTILIZED TO PREVENT FALLS: ? ?Life alert? No  ?Use of a cane, walker or w/c? No  ?Grab bars in the bathroom? No  ?Shower chair or bench in shower? No  ?Elevated toilet seat or a handicapped toilet? No  ? ?TIMED UP AND GO: ? ?Was the test performed? No .  ? ? ? ? ?Cognitive Function: ?  ?  ? ?  01/18/2022  ?  9:35 AM  ?6CIT Screen  ?What Year? 0 points  ?What month? 0 points  ?What time? 0 points  ?Count back from 20 0 points  ?Months in reverse 0 points  ?Repeat phrase 0 points  ?Total Score 0 points  ? ? ?Immunizations ?Immunization History  ?Administered Date(s) Administered  ? Fluad Quad(high Dose 65+) 08/16/2020, 05/28/2021  ? Influenza, High Dose Seasonal PF 07/07/2019  ? PFIZER Comirnaty(Gray Top)Covid-19 Tri-Sucrose Vaccine 03/16/2021  ? PFIZER(Purple Top)SARS-COV-2 Vaccination 01/14/2020, 02/08/2020, 09/15/2020  ? PNEUMOCOCCAL CONJUGATE-20 05/31/2021  ? Tdap 10/12/2019  ? Zoster Recombinat (Shingrix) 03/16/2021, 06/01/2021  ? ? ?TDAP status: Up to date ? ?Flu Vaccine  status: Up to date ? ?Pneumococcal vaccine status: Up to date ? ?Covid-19 vaccine status: Completed vaccines ? ?Qualifies for Shingles Vaccine? Yes   ?Zostavax completed Yes   ?Shingrix Completed?: Yes ? ?Scre

## 2022-01-18 NOTE — Chronic Care Management (AMB) (Addendum)
    Called [name], No answer, left message of appointment on 01-22-2022 at 2:30 via telephone visit with [name], Pharm D. Notified to have all medications, supplements, blood pressure and/or blood sugar logs available during appointment and to return call if need to reschedule.  01-21-2022: Patient's 01-22-2022 appointment rescheduled to 02-01-2022 due to provider being out.  [place] Pharmacist Assistant [phone]

## 2022-01-22 ENCOUNTER — Telehealth: Payer: Medicare Other

## 2022-01-29 ENCOUNTER — Telehealth: Payer: Self-pay

## 2022-01-29 NOTE — Chronic Care Management (AMB) (Signed)
    Called Luan Pulling, No answer, left message of appointment on 02-01-2022 at 2:00 via telephone visit with [name], Pharm D. Notified to have all medications, supplements, blood pressure and/or blood sugar logs available during appointment and to return call if need to reschedule.   [place] Pharmacist Assistant [phone]

## 2022-02-01 ENCOUNTER — Other Ambulatory Visit: Payer: Self-pay | Admitting: Nurse Practitioner

## 2022-02-01 ENCOUNTER — Telehealth: Payer: Medicare Other

## 2022-02-01 DIAGNOSIS — H1010 Acute atopic conjunctivitis, unspecified eye: Secondary | ICD-10-CM

## 2022-02-05 ENCOUNTER — Telehealth: Payer: Self-pay

## 2022-02-05 NOTE — Telephone Encounter (Signed)
  Care Management   Follow Up Note   02/05/2022 Name: [name] MRN: [id number] DOB: Mar 03, 1951   Referred by: [name], FNP Reason for referral : No chief complaint on file.   A second unsuccessful telephone outreach was attempted today. The patient was referred to the case management team for assistance with care management and care coordination.  Left a non -descriptive voicemail to call back.   Follow Up Plan: The patient has been provided with contact information for the care management team and has been advised to call with any health related questions or concerns.   [name], CPP, PharmD Clinical Pharmacist Practitioner [facility] [phone]

## 2022-02-13 ENCOUNTER — Other Ambulatory Visit: Payer: Self-pay | Admitting: Nurse Practitioner

## 2022-02-13 DIAGNOSIS — I10 Essential (primary) hypertension: Secondary | ICD-10-CM

## 2022-02-14 ENCOUNTER — Encounter: Payer: Self-pay | Admitting: Nurse Practitioner

## 2022-02-14 ENCOUNTER — Ambulatory Visit (INDEPENDENT_AMBULATORY_CARE_PROVIDER_SITE_OTHER): Payer: Medicare Other | Admitting: Nurse Practitioner

## 2022-02-14 VITALS — BP 144/82 | HR 71 | Temp 98.1°F | Ht 67.0 in | Wt 220.0 lb

## 2022-02-14 DIAGNOSIS — I10 Essential (primary) hypertension: Secondary | ICD-10-CM | POA: Diagnosis not present

## 2022-02-14 DIAGNOSIS — E785 Hyperlipidemia, unspecified: Secondary | ICD-10-CM

## 2022-02-14 DIAGNOSIS — E6609 Other obesity due to excess calories: Secondary | ICD-10-CM

## 2022-02-14 DIAGNOSIS — Z6834 Body mass index (BMI) 34.0-34.9, adult: Secondary | ICD-10-CM | POA: Diagnosis not present

## 2022-02-14 NOTE — Patient Instructions (Signed)
Hypertension, Adult High blood pressure (hypertension) is when the force of blood pumping through the arteries is too strong. The arteries are the blood vessels that carry blood from the heart throughout the body. Hypertension forces the heart to work harder to pump blood and may cause arteries to become narrow or stiff. Untreated or uncontrolled hypertension can lead to a heart attack, heart failure, a stroke, kidney disease, and other problems. A blood pressure reading consists of a higher number over a lower number. Ideally, your blood pressure should be below 120/80. The first ("top") number is called the systolic pressure. It is a measure of the pressure in your arteries as your heart beats. The second ("bottom") number is called the diastolic pressure. It is a measure of the pressure in your arteries as the heart relaxes. What are the causes? The exact cause of this condition is not known. There are some conditions that result in high blood pressure. What increases the risk? Certain factors may make you more likely to develop high blood pressure. Some of these risk factors are under your control, including: Smoking. Not getting enough exercise or physical activity. Being overweight. Having too much fat, sugar, calories, or salt (sodium) in your diet. Drinking too much alcohol. Other risk factors include: Having a personal history of heart disease, diabetes, high cholesterol, or kidney disease. Stress. Having a family history of high blood pressure and high cholesterol. Having obstructive sleep apnea. Age. The risk increases with age. What are the signs or symptoms? High blood pressure may not cause symptoms. Very high blood pressure (hypertensive crisis) may cause: Headache. Fast or irregular heartbeats (palpitations). Shortness of breath. Nosebleed. Nausea and vomiting. Vision changes. Severe chest pain, dizziness, and seizures. How is this diagnosed? This condition is diagnosed by  measuring your blood pressure while you are seated, with your arm resting on a flat surface, your legs uncrossed, and your feet flat on the floor. The cuff of the blood pressure monitor will be placed directly against the skin of your upper arm at the level of your heart. Blood pressure should be measured at least twice using the same arm. Certain conditions can cause a difference in blood pressure between your right and left arms. If you have a high blood pressure reading during one visit or you have normal blood pressure with other risk factors, you may be asked to: Return on a different day to have your blood pressure checked again. Monitor your blood pressure at home for 1 week or longer. If you are diagnosed with hypertension, you may have other blood or imaging tests to help your health care provider understand your overall risk for other conditions. How is this treated? This condition is treated by making healthy lifestyle changes, such as eating healthy foods, exercising more, and reducing your alcohol intake. You may be referred for counseling on a healthy diet and physical activity. Your health care provider may prescribe medicine if lifestyle changes are not enough to get your blood pressure under control and if: Your systolic blood pressure is above 130. Your diastolic blood pressure is above 80. Your personal target blood pressure may vary depending on your medical conditions, your age, and other factors. Follow these instructions at home: Eating and drinking  Eat a diet that is high in fiber and potassium, and low in sodium, added sugar, and fat. An example of this eating plan is called the DASH diet. DASH stands for Dietary Approaches to Stop Hypertension. To eat this way: Eat   plenty of fresh fruits and vegetables. Try to fill one half of your plate at each meal with fruits and vegetables. Eat whole grains, such as whole-wheat pasta, brown rice, or whole-grain bread. Fill about one  fourth of your plate with whole grains. Eat or drink low-fat dairy products, such as skim milk or low-fat yogurt. Avoid fatty cuts of meat, processed or cured meats, and poultry with skin. Fill about one fourth of your plate with lean proteins, such as fish, chicken without skin, beans, eggs, or tofu. Avoid pre-made and processed foods. These tend to be higher in sodium, added sugar, and fat. Reduce your daily sodium intake. Many people with hypertension should eat less than 1,500 mg of sodium a day. Do not drink alcohol if: Your health care provider tells you not to drink. You are pregnant, may be pregnant, or are planning to become pregnant. If you drink alcohol: Limit how much you have to: 0-1 drink a day for women. 0-2 drinks a day for men. Know how much alcohol is in your drink. In the [place] one drink equals one 12 oz bottle of beer (355 mL), one 5 oz glass of wine (148 mL), or one 1 oz glass of hard liquor (44 mL). Lifestyle  Work with your health care provider to maintain a healthy body weight or to lose weight. Ask what an ideal weight is for you. Get at least 30 minutes of exercise that causes your heart to beat faster (aerobic exercise) most days of the week. Activities may include walking, swimming, or biking. Include exercise to strengthen your muscles (resistance exercise), such as Pilates or lifting weights, as part of your weekly exercise routine. Try to do these types of exercises for 30 minutes at least 3 days a week. Do not use any products that contain nicotine or tobacco. These products include cigarettes, chewing tobacco, and vaping devices, such as e-cigarettes. If you need help quitting, ask your health care provider. Monitor your blood pressure at home as told by your health care provider. Keep all follow-up visits. This is important. Medicines Take over-the-counter and prescription medicines only as told by your health care provider. Follow directions carefully. Blood  pressure medicines must be taken as prescribed. Do not skip doses of blood pressure medicine. Doing this puts you at risk for problems and can make the medicine less effective. Ask your health care provider about side effects or reactions to medicines that you should watch for. Contact a health care provider if you: Think you are having a reaction to a medicine you are taking. Have headaches that keep coming back (recurring). Feel dizzy. Have swelling in your ankles. Have trouble with your vision. Get help right away if you: Develop a severe headache or confusion. Have unusual weakness or numbness. Feel faint. Have severe pain in your chest or abdomen. Vomit repeatedly. Have trouble breathing. These symptoms may be an emergency. Get help right away. Call 911. Do not wait to see if the symptoms will go away. Do not drive yourself to the hospital. Summary Hypertension is when the force of blood pumping through your arteries is too strong. If this condition is not controlled, it may put you at risk for serious complications. Your personal target blood pressure may vary depending on your medical conditions, your age, and other factors. For most people, a normal blood pressure is less than 120/80. Hypertension is treated with lifestyle changes, medicines, or a combination of both. Lifestyle changes include losing weight, eating a healthy,   low-sodium diet, exercising more, and limiting alcohol. This information is not intended to replace advice given to you by your health care provider. Make sure you discuss any questions you have with your health care provider. Document Revised: 07/03/2021 Document Reviewed: 07/03/2021 Elsevier Patient Education  2023 Elsevier Inc.  

## 2022-02-14 NOTE — Progress Notes (Signed)
I,Tianna Badgett,acting as a Education administrator for [employer], FNP.,have documented all relevant documentation on the behalf of [name], FNP,as directed by  [name], FNP while in the presence of [name], [place].  This visit occurred during the SARS-CoV-2 public health emergency.  Safety protocols were in place, including screening questions prior to the visit, additional usage of staff PPE, and extensive cleaning of exam room while observing appropriate contact time as indicated for disinfecting solutions.  Subjective:     Patient ID: [name] , male    DOB: [birth year] , 71 y.o.   MRN: [id number]   Chief Complaint  Patient presents with   Hypertension    HPI  Patient presents today for a blood pressure f/u. He has been exercising with walking 2 days a week. Walks about 2 miles each day. He tries to limit his salt intake. He took melatonin which he has stopped due to feeling like it is too much. He is only drinking 2 bottles of water daily.  Hypertension This is a chronic problem. The current episode started more than 1 year ago. The problem has been gradually improving since onset. The problem is controlled. Pertinent negatives include no anxiety, chest pain, headaches, palpitations or shortness of breath. There are no associated agents to hypertension. Risk factors for coronary artery disease include sedentary lifestyle and obesity. Past treatments include diuretics and angiotensin blockers. There are no compliance problems.  There is no history of angina. There is no history of chronic renal disease.     Past Medical History:  Diagnosis Date   Hypertension      Family History  Problem Relation Age of Onset   Dementia Father    Colon cancer Neg Hx    Colon polyps Neg Hx    Esophageal cancer Neg Hx    Rectal cancer Neg Hx    Stomach cancer Neg Hx      Current Outpatient Medications:    hydrochlorothiazide (HYDRODIURIL) 12.5 MG tablet, Take 1 tablet (12.5 mg total)  by mouth daily., Disp: 90 tablet, Rfl: 2   losartan (COZAAR) 100 MG tablet, TAKE 1 TABLET BY MOUTH EVERY DAY, Disp: 90 tablet, Rfl: 1   atorvastatin (LIPITOR) 20 MG tablet, TAKE 1 TABLET BY MOUTH EVERY DAY, Disp: 90 tablet, Rfl: 2   No Known Allergies   Review of Systems  Constitutional: Negative.   Respiratory: Negative.  Negative for shortness of breath.   Cardiovascular: Negative.  Negative for chest pain, palpitations and leg swelling.  Gastrointestinal: Negative.   Neurological: Negative.  Negative for headaches.  Psychiatric/Behavioral: Negative.       Today's Vitals   02/14/22 1048 02/14/22 1138  BP: 140/80 (!) 144/82  Pulse: 71   Temp: 98.1 F (36.7 C)   TempSrc: Oral   Weight: 220 lb (99.8 kg)   Height: '5\' 7"'  (1.702 m)    Body mass index is 34.46 kg/m.  Wt Readings from Last 3 Encounters:  02/14/22 220 lb (99.8 kg)  01/18/22 215 lb (97.5 kg)  11/05/21 221 lb 3.2 oz (100.3 kg)    Objective:  Physical Exam Vitals reviewed.  Constitutional:      General: He is not in acute distress.    Appearance: Normal appearance. He is obese.  Cardiovascular:     Rate and Rhythm: Normal rate and regular rhythm.     Pulses: Normal pulses.     Heart sounds: Normal heart sounds. No murmur heard. Pulmonary:     Effort: Pulmonary effort  is normal. No respiratory distress.     Breath sounds: Normal breath sounds. No wheezing.  Skin:    General: Skin is warm and dry.     Capillary Refill: Capillary refill takes less than 2 seconds.     Coloration: Skin is not jaundiced.     Findings: No bruising.  Neurological:     General: No focal deficit present.     Mental Status: He is alert and oriented to person, place, and time.     Cranial Nerves: No cranial nerve deficit.     Motor: No weakness.  Psychiatric:        Mood and Affect: Mood normal.        Behavior: Behavior normal.        Thought Content: Thought content normal.        Judgment: Judgment normal.          Assessment And Plan:     1. Essential hypertension Comments: Blood pressure is slightly elevated, repeat bp done. Encouraged to increase water intake - CMP14+EGFR  2. Dyslipidemia Comments: Continue statin, tolerating well. - CMP14+EGFR - Lipid panel  3. Class 1 obesity due to excess calories with serious comorbidity and body mass index (BMI) of 34.0 to 34.9 in adult  He is encouraged to initially strive for BMI less than 30 to decrease cardiac risk. He is advised to exercise no less than 150 minutes per week.     Patient was given opportunity to ask questions. Patient verbalized understanding of the plan and was able to repeat key elements of the plan. All questions were answered to their satisfaction.  [name], FNP   I, [name], FNP, have reviewed all documentation for this visit. The documentation on 02/14/22 for the exam, diagnosis, procedures, and orders are all accurate and complete.   IF YOU HAVE BEEN REFERRED TO A SPECIALIST, IT MAY TAKE 1-2 WEEKS TO SCHEDULE/PROCESS THE REFERRAL. IF YOU HAVE NOT HEARD FROM US/SPECIALIST IN TWO WEEKS, PLEASE GIVE [place] A CALL AT [phone].   THE PATIENT IS ENCOURAGED TO PRACTICE SOCIAL DISTANCING DUE TO THE COVID-19 PANDEMIC.

## 2022-02-15 LAB — CMP14+EGFR
ALT: 24 IU/L (ref 0–44)
AST: 20 IU/L (ref 0–40)
Albumin/Globulin Ratio: 1.5 (ref 1.2–2.2)
Albumin: 4.5 g/dL (ref 3.7–4.7)
Alkaline Phosphatase: 76 IU/L (ref 44–121)
BUN/Creatinine Ratio: 15 (ref 10–24)
BUN: 18 mg/dL (ref 8–27)
Bilirubin Total: 0.7 mg/dL (ref 0.0–1.2)
CO2: 26 mmol/L (ref 20–29)
Calcium: 9.5 mg/dL (ref 8.6–10.2)
Chloride: 98 mmol/L (ref 96–106)
Creatinine, Ser: 1.19 mg/dL (ref 0.76–1.27)
Globulin, Total: 3 g/dL (ref 1.5–4.5)
Glucose: 92 mg/dL (ref 70–99)
Potassium: 4.9 mmol/L (ref 3.5–5.2)
Sodium: 138 mmol/L (ref 134–144)
Total Protein: 7.5 g/dL (ref 6.0–8.5)
eGFR: 65 mL/min/{1.73_m2} (ref >59)

## 2022-02-15 LAB — LIPID PANEL
Chol/HDL Ratio: 4.3 ratio (ref 0.0–5.0)
Cholesterol, Total: 185 mg/dL (ref 100–199)
HDL: 43 mg/dL (ref >39)
LDL Chol Calc (NIH): 100 mg/dL — ABNORMAL HIGH (ref 0–99)
Triglycerides: 244 mg/dL — ABNORMAL HIGH (ref 0–149)
VLDL Cholesterol Cal: 42 mg/dL — ABNORMAL HIGH (ref 5–40)

## 2022-02-18 ENCOUNTER — Other Ambulatory Visit: Payer: Self-pay | Admitting: Nurse Practitioner

## 2022-03-01 ENCOUNTER — Telehealth: Payer: Self-pay

## 2022-03-01 MED ORDER — VALSARTAN 80 MG PO TABS: 80.0000 mg | ORAL_TABLET | Freq: Every day | ORAL | 0 refills | Status: DC

## 2022-03-07 ENCOUNTER — Other Ambulatory Visit: Payer: Self-pay | Admitting: Nurse Practitioner

## 2022-03-07 DIAGNOSIS — E6609 Other obesity due to excess calories: Secondary | ICD-10-CM

## 2022-03-08 ENCOUNTER — Telehealth: Payer: Self-pay

## 2022-03-08 NOTE — Chronic Care Management (AMB) (Signed)
    Chronic Care Management Pharmacy Assistant   Name: [name]  MRN: [id number] DOB: 06-06-1951  Reason for Encounter: Disease State/ Hypertension  Recent office visits:  02-14-2022 [name], [name]. Trig= 244, VLDL= 42, LDL= 100.   01-18-2022 [name], LPN. Medicare annual wellness visit.  Recent consult visits:  None  Hospital visits:  None in previous 6 months  Medications: Outpatient Encounter Medications as of 03/08/2022  Medication Sig   atorvastatin (LIPITOR) 20 MG tablet TAKE 1 TABLET BY MOUTH EVERY DAY   hydrochlorothiazide (HYDRODIURIL) 12.5 MG tablet Take 1 tablet (12.5 mg total) by mouth daily.   valsartan (DIOVAN) 80 MG tablet Take 1 tablet (80 mg total) by mouth daily.   No facility-administered encounter medications on file as of 03/08/2022.  Reviewed chart prior to disease state call.   Recent Office Vitals: BP Readings from Last 3 Encounters:  02/14/22 (!) 144/82  11/05/21 (!) 142/82  05/28/21 136/80   Pulse Readings from Last 3 Encounters:  02/14/22 71  11/05/21 84  05/28/21 74    Wt Readings from Last 3 Encounters:  02/14/22 220 lb (99.8 kg)  01/18/22 215 lb (97.5 kg)  11/05/21 221 lb 3.2 oz (100.3 kg)     Kidney Function Lab Results  Component Value Date/Time   CREATININE 1.19 02/14/2022 12:10 PM   CREATININE 1.16 11/05/2021 02:46 PM   GFRNONAA 63 08/21/2020 02:53 PM   GFRAA 73 08/21/2020 02:53 PM       Latest Ref Rng & Units 02/14/2022   12:10 PM 11/05/2021    2:46 PM 04/09/2021   11:43 AM  BMP  Glucose 70 - 99 mg/dL 92  107  89   BUN 8 - 27 mg/dL '18  18  15   '$ Creatinine 0.76 - 1.27 mg/dL 1.19  1.16  1.17   BUN/Creat Ratio 10 - '24 15  16  13   '$ Sodium 134 - 144 mmol/L 138  142  142   Potassium 3.5 - 5.2 mmol/L 4.9  4.6  4.9   Chloride 96 - 106 mmol/L 98  102  101   CO2 20 - 29 mmol/L '26  26  26   '$ Calcium 8.6 - 10.2 mg/dL 9.5  9.4  9.3     03-08-2022: 1st attempt. Will call patient on 03-11-2022.  Care  Gaps: Covid booster overdue  Star Rating Drugs: Atorvastatin 20 mg- Last filled [facility]-18-2022 [facility] Valsartan 80 mg- Last filled [facility]-01-2022 [facility]  [place] Clinical Pharmacist Assistant [phone]

## 2022-03-15 ENCOUNTER — Telehealth: Payer: Self-pay

## 2022-03-15 NOTE — Chronic Care Management (AMB) (Signed)
Chronic Care Management Pharmacy Assistant   Name: [name]  MRN: [id number] DOB: 1951/05/17  Reason for Encounter: Disease State/ Hypertension  Recent office visits:  02-14-2022 [name], [place]. Trig= 244, VLDL= 42, LDL= 100.    01-18-2022 [name], LPN. Medicare annual wellness visit.  Recent consult visits:  None  Hospital visits:  None in previous 6 months  Medications: Outpatient Encounter Medications as of 03/15/2022  Medication Sig   atorvastatin (LIPITOR) 20 MG tablet TAKE 1 TABLET BY MOUTH EVERY DAY   hydrochlorothiazide (HYDRODIURIL) 12.5 MG tablet Take 1 tablet (12.5 mg total) by mouth daily.   valsartan (DIOVAN) 80 MG tablet Take 1 tablet (80 mg total) by mouth daily.   No facility-administered encounter medications on file as of 03/15/2022.  Reviewed chart prior to disease state call. Spoke with patient regarding BP  Recent Office Vitals: BP Readings from Last 3 Encounters:  02/14/22 (!) 144/82  11/05/21 (!) 142/82  05/28/21 136/80   Pulse Readings from Last 3 Encounters:  02/14/22 71  11/05/21 84  05/28/21 74    Wt Readings from Last 3 Encounters:  02/14/22 220 lb (99.8 kg)  01/18/22 215 lb (97.5 kg)  11/05/21 221 lb 3.2 oz (100.3 kg)     Kidney Function Lab Results  Component Value Date/Time   CREATININE 1.19 02/14/2022 12:10 PM   CREATININE 1.16 11/05/2021 02:46 PM   GFRNONAA 63 08/21/2020 02:53 PM   GFRAA 73 08/21/2020 02:53 PM       Latest Ref Rng & Units 02/14/2022   12:10 PM 11/05/2021    2:46 PM 04/09/2021   11:43 AM  BMP  Glucose 70 - 99 mg/dL 92  107  89   BUN 8 - 27 mg/dL '18  18  15   '$ Creatinine 0.76 - 1.27 mg/dL 1.19  1.16  1.17   BUN/Creat Ratio 10 - '24 15  16  13   '$ Sodium 134 - 144 mmol/L 138  142  142   Potassium 3.5 - 5.2 mmol/L 4.9  4.6  4.9   Chloride 96 - 106 mmol/L 98  102  101   CO2 20 - 29 mmol/L '26  26  26   '$ Calcium 8.6 - 10.2 mg/dL 9.5  9.4  9.3     Current antihypertensive regimen:  HCTZ 12.5  mg daily Valsartan 80 mg daily  How often are you checking your Blood Pressure? daily  Current home BP readings: 128/58 83, 130/60 72  What recent interventions/DTPs have been made by any provider to improve Blood Pressure control since last CPP Visit:  Educated on Daily salt intake goal < 2300 mg; Importance of home blood pressure monitoring; Proper BP monitoring technique; -Counseled to monitor BP at home three time per week, document, and provide log at future appointments.  Any recent hospitalizations or ED visits since last visit with CPP? No  What diet changes have been made to improve Blood Pressure Control?  Patient stated he limits salt intake and drinks plenty of water.  What exercise is being done to improve your Blood Pressure Control?  Patient states he is active with taking care of his wife.  Adherence Review: Is the patient currently on ACE/ARB medication? Yes Does the patient have >5 day gap between last estimated fill dates? No  Care Gaps: Covid booster overdue  Star Rating Drugs: Atorvastatin 20 mg- Last filled [facility]-18-2022 [facility] Valsartan 80 mg- Last filled [facility]-01-2022 [facility]  [name] CMA  Clinical Pharmacist Assistant [phone]

## 2022-03-22 ENCOUNTER — Telehealth: Payer: Self-pay

## 2022-03-22 NOTE — Chronic Care Management (AMB) (Signed)
    Chronic Care Management Pharmacy Assistant   Name: [name]  MRN: [id number] DOB: 08/06/1951  Reason for Encounter: Disease State/ Hypertension  Recent office visits:  None  Recent consult visits:  None  Hospital visits:  None in previous 6 months  Medications: Outpatient Encounter Medications as of 03/22/2022  Medication Sig   atorvastatin (LIPITOR) 20 MG tablet TAKE 1 TABLET BY MOUTH EVERY DAY   hydrochlorothiazide (HYDRODIURIL) 12.5 MG tablet Take 1 tablet (12.5 mg total) by mouth daily.   valsartan (DIOVAN) 80 MG tablet Take 1 tablet (80 mg total) by mouth daily.   No facility-administered encounter medications on file as of 03/22/2022.  Reviewed chart prior to disease state call. Spoke with patient regarding BP  Recent Office Vitals: BP Readings from Last 3 Encounters:  02/14/22 (!) 144/82  11/05/21 (!) 142/82  05/28/21 136/80   Pulse Readings from Last 3 Encounters:  02/14/22 71  11/05/21 84  05/28/21 74    Wt Readings from Last 3 Encounters:  02/14/22 220 lb (99.8 kg)  01/18/22 215 lb (97.5 kg)  11/05/21 221 lb 3.2 oz (100.3 kg)     Kidney Function Lab Results  Component Value Date/Time   CREATININE 1.19 02/14/2022 12:10 PM   CREATININE 1.16 11/05/2021 02:46 PM   GFRNONAA 63 08/21/2020 02:53 PM   GFRAA 73 08/21/2020 02:53 PM       Latest Ref Rng & Units 02/14/2022   12:10 PM 11/05/2021    2:46 PM 04/09/2021   11:43 AM  BMP  Glucose 70 - 99 mg/dL 92  107  89   BUN 8 - 27 mg/dL '18  18  15   '$ Creatinine 0.76 - 1.27 mg/dL 1.19  1.16  1.17   BUN/Creat Ratio 10 - '24 15  16  13   '$ Sodium 134 - 144 mmol/L 138  142  142   Potassium 3.5 - 5.2 mmol/L 4.9  4.6  4.9   Chloride 96 - 106 mmol/L 98  102  101   CO2 20 - 29 mmol/L '26  26  26   '$ Calcium 8.6 - 10.2 mg/dL 9.5  9.4  9.3     Current antihypertensive regimen:  HCTZ 12.5 mg daily Valsartan 80 mg daily  How often are you checking your Blood Pressure? daily  Current home BP readings: 162/80,  57, 153/78 74, 166/79 76  What recent interventions/DTPs have been made by any provider to improve Blood Pressure control since last CPP Visit:  Educated on Daily salt intake goal < 2300 mg; Importance of home blood pressure monitoring; Proper BP monitoring technique; -Counseled to monitor BP at home three time per week, document, and provide log at future appointments.  Any recent hospitalizations or ED visits since last visit with CPP? No  What diet changes have been made to improve Blood Pressure Control?  Patient stated he limits salt intake and drinks plenty of water.  What exercise is being done to improve your Blood Pressure Control?  Patient stated he walks twice weekly and takes care of wife.  Adherence Review: Is the patient currently on ACE/ARB medication? Yes Does the patient have >5 day gap between last estimated fill dates? No   Care Gaps: Covid booster overdue  Star Rating Drugs: Atorvastatin 20 mg- Last filled [facility]-18-2022 [facility] Valsartan 80 mg- Last [facility]  [place] Clinical Pharmacist Assistant [phone]

## 2022-03-29 ENCOUNTER — Telehealth: Payer: Self-pay

## 2022-03-29 NOTE — Chronic Care Management (AMB) (Addendum)
Chronic Care Management Pharmacy Assistant   Name: [name]  MRN: [id number] DOB: 1951-06-26  Reason for Encounter: Disease State/ Hypertension  Recent office visits:  None  Recent consult visits:  None  Hospital visits:  None in previous 6 months  Medications: Outpatient Encounter Medications as of 03/29/2022  Medication Sig   atorvastatin (LIPITOR) 20 MG tablet TAKE 1 TABLET BY MOUTH EVERY DAY   hydrochlorothiazide (HYDRODIURIL) 12.5 MG tablet Take 1 tablet (12.5 mg total) by mouth daily.   valsartan (DIOVAN) 80 MG tablet Take 1 tablet (80 mg total) by mouth daily.   No facility-administered encounter medications on file as of 03/29/2022.   Reviewed chart prior to disease state call. Spoke with patient regarding BP  Recent Office Vitals: BP Readings from Last 3 Encounters:  02/14/22 (!) 144/82  11/05/21 (!) 142/82  05/28/21 136/80   Pulse Readings from Last 3 Encounters:  02/14/22 71  11/05/21 84  05/28/21 74    Wt Readings from Last 3 Encounters:  02/14/22 220 lb (99.8 kg)  01/18/22 215 lb (97.5 kg)  11/05/21 221 lb 3.2 oz (100.3 kg)     Kidney Function Lab Results  Component Value Date/Time   CREATININE 1.19 02/14/2022 12:10 PM   CREATININE 1.16 11/05/2021 02:46 PM   GFRNONAA 63 08/21/2020 02:53 PM   GFRAA 73 08/21/2020 02:53 PM       Latest Ref Rng & Units 02/14/2022   12:10 PM 11/05/2021    2:46 PM 04/09/2021   11:43 AM  BMP  Glucose 70 - 99 mg/dL 92  107  89   BUN 8 - 27 mg/dL '18  18  15   '$ Creatinine 0.76 - 1.27 mg/dL 1.19  1.16  1.17   BUN/Creat Ratio 10 - '24 15  16  13   '$ Sodium 134 - 144 mmol/L 138  142  142   Potassium 3.5 - 5.2 mmol/L 4.9  4.6  4.9   Chloride 96 - 106 mmol/L 98  102  101   CO2 20 - 29 mmol/L '26  26  26   '$ Calcium 8.6 - 10.2 mg/dL 9.5  9.4  9.3     Current antihypertensive regimen:  HCTZ 12.5 mg daily Valsartan 80 mg daily  How often are you checking your Blood Pressure? daily  Current home BP readings:07-20  98/66 79, 100/63 70, 07-19 161/80 56, 07-18 151/79 60, 07-17 163/81 55  What recent interventions/DTPs have been made by any provider to improve Blood Pressure control since last CPP Visit:  Educated on Daily salt intake goal < 2300 mg; Importance of home blood pressure monitoring; Proper BP monitoring technique; -Counseled to monitor BP at home three time per week, document, and provide log at future appointments.  Any recent hospitalizations or ED visits since last visit with CPP? No  What diet changes have been made to improve Blood Pressure Control?  Patient stated he limits salt intake and drinks plenty of water.  What exercise is being done to improve your Blood Pressure Control?  Patient stated he walks twice weekly and takes care of wife.  Adherence Review: Is the patient currently on ACE/ARB medication? Yes Does the patient have >5 day gap between last estimated fill dates? No  NOTES: Patient was reminded of telephone appointment with [name] on 04-03-2022 at 10:00.  Care Gaps: Covid booster overdue  Star Rating Drugs: Atorvastatin 20 mg- Last [facility]18-20[facility] Valsartan 80 mg- [facility]  [place] Pharmacist Assistant [phone]

## 2022-04-03 ENCOUNTER — Ambulatory Visit (INDEPENDENT_AMBULATORY_CARE_PROVIDER_SITE_OTHER): Payer: Medicare Other

## 2022-04-03 DIAGNOSIS — I10 Essential (primary) hypertension: Secondary | ICD-10-CM

## 2022-04-03 DIAGNOSIS — E785 Hyperlipidemia, unspecified: Secondary | ICD-10-CM

## 2022-04-03 MED ORDER — VALSARTAN 160 MG PO TABS: 160.0000 mg | ORAL_TABLET | Freq: Every day | ORAL | 0 refills | Status: DC

## 2022-04-03 MED ORDER — ATORVASTATIN CALCIUM 40 MG PO TABS: 40.0000 mg | ORAL_TABLET | Freq: Every day | ORAL | 0 refills | Status: DC

## 2022-04-03 NOTE — Progress Notes (Signed)
Chronic Care Management Pharmacy Note  04/03/2022 Name:  [name] MRN:  [id number] DOB:  1951/09/07  Summary: Patient reports taking his BP medication daily and doing well with it.   Recommendations/Changes made from today's visit: Recommend patients Hydrochlorothiazide be increased to 25 mg tablet daily. Continue current BP readings and write down in a log book.  Increase water intake by 1 bottle Increase Atorvastatin to 40 mg tablet daily   Plan: Collaborate with PCP to increase Hydrochlorothiazide 25 mg tablet once per day, [facility]. Collaborate with PCP to increase Atorvastatin to 40 mg tablet daily.  HC to call once per week to determine how well he is doing with his BP after increasing his medication.    Subjective: [name] is an 71 y.o. year old male who is a primary patient of [name], [place].  The CCM team was consulted for assistance with disease management and care coordination needs.    Engaged with patient by telephone for follow up visit in response to provider referral for pharmacy case management and/or care coordination services. Patient reports that he had a bad reaction to yellow jackets bees where he could not stop itching and scratching. He reports that he was stung before but never had a reaction like this.   Consent to Services:  The patient was given information about Chronic Care Management services, agreed to services, and gave verbal consent prior to initiation of services.  Please see initial visit note for detailed documentation.   Patient Care Team: Jose Brine, FNP as PCP - General (General Practice) [name], [facility] (Pharmacist)  Recent office visits: 02/14/2022 PCP OV  Recent consult visits: None noted   Hospital visits: None in previous 6 months   Objective:  Lab Results  Component Value Date   CREATININE 1.19 02/14/2022   BUN 18 02/14/2022   EGFR 65 02/14/2022    GFRNONAA 63 08/21/2020   GFRAA 73 08/21/2020   NA 138 02/14/2022   K 4.9 02/14/2022   CALCIUM 9.5 02/14/2022   CO2 26 02/14/2022   GLUCOSE 92 02/14/2022    Lab Results  Component Value Date/Time   HGBA1C 5.1 08/21/2020 02:53 PM   HGBA1C 4.8 08/04/2019 10:13 AM   MICROALBUR 150 08/21/2020 12:57 PM    Last diabetic Eye exam: No results found for: "HMDIABEYEEXA"  Last diabetic Foot exam: No results found for: "HMDIABFOOTEX"   Lab Results  Component Value Date   CHOL 185 02/14/2022   HDL 43 02/14/2022   LDLCALC 100 (H) 02/14/2022   TRIG 244 (H) 02/14/2022   CHOLHDL 4.3 02/14/2022       Latest Ref Rng & Units 02/14/2022   12:10 PM 11/14/2020   12:48 PM 08/21/2020    2:53 PM  Hepatic Function  Total Protein 6.0 - 8.5 g/dL 7.5  7.2  7.0   Albumin 3.7 - 4.7 g/dL 4.5  4.3  4.0   AST 0 - 40 IU/L '20  21  22   ' ALT 0 - 44 IU/L '24  27  29   ' Alk Phosphatase 44 - 121 IU/L 76  78  78   Total Bilirubin 0.0 - 1.2 mg/dL 0.7  0.5  0.6     Lab Results  Component Value Date/Time   TSH 1.660 08/04/2019 10:13 AM       Latest Ref Rng & Units 08/21/2020    2:53 PM 04/24/2009   11:13 AM 01/22/2008  9:05 AM  CBC  WBC 3.4 - 10.8 x10E3/uL 5.1   4.7   Hemoglobin 13.0 - 17.7 g/dL 15.8  17.0  11.9   Hematocrit 37.5 - 51.0 % 46.9   35.0   Platelets 150 - 450 x10E3/uL 148   179     No results found for: "VD25OH"  Clinical ASCVD: No  The 10-year ASCVD risk score (Arnett DK, et al., 2019) is: 24.4%   Values used to calculate the score:     Age: 89 years     Sex: Male     Is Non-Hispanic African American: Yes     Diabetic: No     Tobacco smoker: No     Systolic Blood Pressure: 448 mmHg     Is BP treated: Yes     HDL Cholesterol: 43 mg/dL     Total Cholesterol: 185 mg/dL       01/18/2022    9:34 AM 11/05/2021    2:10 PM 08/21/2020    9:49 AM  Depression screen PHQ 2/9  Decreased Interest 0 0 0  Down, Depressed, Hopeless 0 0 0  PHQ - 2 Score 0 0 0  Altered sleeping  0   Tired,  decreased energy  0   Change in appetite  0   Feeling bad or failure about yourself   0   Trouble concentrating  0   Moving slowly or fidgety/restless  0   Suicidal thoughts  0   PHQ-9 Score  0       Social History   Tobacco Use  Smoking Status Never  Smokeless Tobacco Never   BP Readings from Last 3 Encounters:  02/14/22 (!) 144/82  11/05/21 (!) 142/82  05/28/21 136/80   Pulse Readings from Last 3 Encounters:  02/14/22 71  11/05/21 84  05/28/21 74   Wt Readings from Last 3 Encounters:  02/14/22 220 lb (99.8 kg)  01/18/22 215 lb (97.5 kg)  11/05/21 221 lb 3.2 oz (100.3 kg)   BMI Readings from Last 3 Encounters:  02/14/22 34.46 kg/m  01/18/22 33.67 kg/m  11/05/21 37.27 kg/m    Assessment/Interventions: Review of patient past medical history, allergies, medications, health status, including review of consultants reports, laboratory and other test data, was performed as part of comprehensive evaluation and provision of chronic care management services.   SDOH:  (Social Determinants of Health) assessments and interventions performed: No  SDOH Screenings   Alcohol Screen: Not on file  Depression (PHQ2-9): Low Risk  (01/18/2022)   Depression (PHQ2-9)    PHQ-2 Score: 0  Financial Resource Strain: Low Risk  (01/18/2022)   Overall Financial Resource Strain (CARDIA)    Difficulty of Paying Living Expenses: Not hard at all  Food Insecurity: No Food Insecurity (01/18/2022)   Hunger Vital Sign    Worried About Running Out of Food in the Last Year: Never true    Ran Out of Food in the Last Year: Never true  Housing: Not on file  Physical Activity: Insufficiently Active (01/18/2022)   Exercise Vital Sign    Days of Exercise per Week: 2 days    Minutes of Exercise per Session: 60 min  Social Connections: Unknown (08/04/2019)   Social Connection and Isolation Panel [NHANES]    Frequency of Communication with Friends and Family: Patient refused    Frequency of Social  Gatherings with Friends and Family: Patient refused    Attends Religious Services: Patient refused    Active Member of Clubs or Organizations: Patient refused  Attends Archivist Meetings: Patient refused    Marital Status: Patient refused  Stress: Stress Concern Present (01/18/2022)   [place]    Feeling of Stress : To some extent  Tobacco Use: Low Risk  (02/14/2022)   Patient History    Smoking Tobacco Use: Never    Smokeless Tobacco Use: Never    Passive Exposure: Not on file  Transportation Needs: No Transportation Needs (01/18/2022)   PRAPARE - Transportation    Lack of Transportation (Medical): No    Lack of Transportation (Non-Medical): No    CCM Care Plan  No Known Allergies  Medications Reviewed Today     Reviewed by Jose Brine, FNP (Family Nurse Practitioner) on 02/14/22 at 1058  Med List Status: <None>   Medication Order Taking? Sig Documenting Provider Last Dose Status Informant  atorvastatin (LIPITOR) 20 MG tablet [id number] Yes Take 1 tablet (20 mg total) by mouth daily. Jose Brine, FNP Taking Active   hydrochlorothiazide (HYDRODIURIL) 12.5 MG tablet [id number] Yes Take 1 tablet (12.5 mg total) by mouth daily. Jose Brine, FNP Taking Active   losartan (COZAAR) 100 MG tablet [id number] Yes TAKE 1 TABLET BY MOUTH EVERY DAY Jose Brine, FNP Taking Active             Patient Active Problem List   Diagnosis Date Noted   Essential hypertension 08/21/2020   Elevated cholesterol 08/21/2020    Immunization History  Administered Date(s) Administered   Fluad Quad(high Dose 65+) 08/16/2020, 05/28/2021   Influenza, High Dose Seasonal PF 07/07/2019   PFIZER Comirnaty(Gray Top)Covid-19 Tri-Sucrose Vaccine 03/16/2021   PFIZER(Purple Top)SARS-COV-2 Vaccination 01/14/2020, 02/08/2020, 09/15/2020   PNEUMOCOCCAL CONJUGATE-20 05/31/2021   Tdap 10/12/2019   Zoster Recombinat (Shingrix)  03/16/2021, 06/01/2021    Conditions to be addressed/monitored:  Hypertension and Hyperlipidemia  Care Plan : [place]  Updates made by [name], RPH since 04/03/2022 12:00 AM     Problem: HTN, HLD      Long-Range Goal: Patient Stated   Recent Progress: On track  Priority: High  Note:   Current Barriers:  Unable to independently monitor therapeutic efficacy Unable to achieve control of HTN and HLD   Pharmacist Clinical Goal(s):  Patient will achieve control of BP, and LDL  as evidenced by BP readings and LDL/triglycerides through collaboration with PharmD and provider.   Interventions: 1:1 collaboration with Jose Brine, FNP regarding development and update of comprehensive plan of care as evidenced by provider attestation and co-signature Inter-disciplinary care team collaboration (see longitudinal plan of care) Comprehensive medication review performed; medication list updated in electronic medical record  Hypertension (BP goal <130/80) -Uncontrolled -Current treatment: Hydrochlorothiazide 12.5 mg tablet once per day Appropriate, Query effective Patient reports he has 90 day supply  Valsartan 80 mg tablet once per day. Appropriate, Query effective Patient reports that he has a 90 day supply of medication  -Medications previously tried: Losartan 100  -Current home readings: 7/26 - 163/98 pulse: 70, 7/17-163/81 pulse: 55, 7/18-151/79 pulse: 60 beats per minute, 161/80 pulse: 56 beats per minute, 7/20 - 98/66, 100/63 pulse: 70 beats per minute  -Current dietary habits: during the weekdays they are eating broiled fish and vegetables.Sometimes [place] sandwiches, cole slaw and tuna fish.  -Current exercise habits: He reports that he and his wife walk the [employer] [place] park once per  -Denies hypotensive/hypertensive symptoms -Educated on BP goals and benefits of medications for prevention of heart attack, stroke and kidney  damage; Importance of  home blood pressure monitoring; Proper BP monitoring technique; -Counseled to monitor BP at home at least five times per week, document, and provide log at future appointments -Recommended to continue current medication Collaborated with PCP team to increase patients Valsartan from 80 mg tablet once per day to 160 mg tablet once per day.  Will recommend patient have assessment in one month to determine how well his BP has improved since this change.   Hyperlipidemia: (LDL goal < 100) -Uncontrolled -Current treatment: Atorvastatin 20 mg tablet once per day. Appropriate, Query effective -Medications previously tried: none noted   -Current dietary patterns: during the weekdays they are eating broiled fish and vegetables.Sometimes [place] sandwiches, cole slaw and tuna fish.  -Current exercise habits: please see hypertension for more information  -Educated on Cholesterol goals;  Importance of limiting foods high in cholesterol;  -Collaborated with PCP team to increase patients Atorvastatin to 40 mg tablet daily.   Patient Goals/Self-Care Activities Patient will:  - check blood pressure at least once per day, document, and provide at future appointments - Start taking Atorvastatin 40 mg tablet once per day, and Valsartan 160 mg tablets once per day   Follow Up Plan: Telephone follow up appointment with care management team member scheduled for: 05/07/2022 at 9 AM . The patient has been provided with contact information for the care management team and has been advised to call with any health related questions or concerns.      Medication Assistance: None required.  Patient affirms current coverage meets needs.  Compliance/Adherence/Medication fill history: Care Gaps: COVID-19 vaccine  Star-Rating Drugs: Valsartan 80 mg tablet  Atorvastatin 20 mg tablet   Patient[facility]  [id number]: [phone]: [phone] [facility] [place]Phone: [phone]: [phone] Uses pill box? No - he is keeping his medication in vials  Pt endorses 90% compliance  We discussed: Benefits of medication synchronization, packaging and delivery as well as enhanced pharmacist oversight with [employer]. Patient decided to: Continue current medication management strategy  Care Plan and Follow Up Patient Decision:  Patient agrees to Care Plan and Follow-up.  Plan: The patient has been provided with contact information for the care management team and has been advised to call with any health related questions or concerns.   VOrlando Penner CPP, PharmD Clinical Pharmacist Practitioner [facility] [phone]

## 2022-04-03 NOTE — Patient Instructions (Signed)
Visit Information It was great speaking with you today!  Please let me know if you have any questions about our visit.   Goals Addressed             This Visit's Progress    Track and Manage My Blood Pressure-Hypertension       Timeframe:  Long-Range Goal Priority:  High Start Date:                             Expected End Date:                       Follow Up Date 05/07/2022   In Progress:   - check blood pressure 5 times per week - choose a place to take my blood pressure (home, clinic or office, retail store) - write blood pressure results in a log or diary  -Please START taking Valsartan 160 mg tablet, and STOP taking Valsartan 80 mg    Why is this important?   You won't feel high blood pressure, but it can still hurt your blood vessels.  High blood pressure can cause heart or kidney problems. It can also cause a stroke.  Making lifestyle changes like losing a little weight or eating less salt will help.  Checking your blood pressure at home and at different times of the day can help to control blood pressure.  If the doctor prescribes medicine remember to take it the way the doctor ordered.  Call the office if you cannot afford the medicine or if there are questions about it.     Notes:  -Please let us know if you have any questions         Patient Care Plan: CCM Pharmacy Care Plan     Problem Identified: HTN, HLD      Long-Range Goal: Patient Stated   Recent Progress: On track  Priority: High  Note:   Current Barriers:  Unable to independently monitor therapeutic efficacy Unable to achieve control of HTN and HLD   Pharmacist Clinical Goal(s):  Patient will achieve control of BP, and LDL  as evidenced by BP readings and LDL/triglycerides through collaboration with PharmD and provider.   Interventions: 1:1 collaboration with [name], FNP regarding development and update of comprehensive plan of care as evidenced by provider attestation and  co-signature Inter-disciplinary care team collaboration (see longitudinal plan of care) Comprehensive medication review performed; medication list updated in electronic medical record  Hypertension (BP goal <130/80) -Uncontrolled -Current treatment: Hydrochlorothiazide 12.5 mg tablet once per day Appropriate, Query effective Patient reports he has 90 day supply  Valsartan 80 mg tablet once per day. Appropriate, Query effective Patient reports that he has a 90 day supply of medication  -Medications previously tried: Losartan 100  -Current home readings: 7/26 - 163/98 pulse: 70, 7/17-163/81 pulse: 55, 7/18-151/79 pulse: 60 beats per minute, 161/80 pulse: 56 beats per minute, 7/20 - 98/66, 100/63 pulse: 70 beats per minute  -Current dietary habits: during the weekdays they are eating broiled fish and vegetables.Sometimes [place] sandwiches, cole slaw and tuna fish.  -Current exercise habits: He reports that he and his wife walk the [employer] park once per  -Denies hypotensive/hypertensive symptoms -Educated on BP goals and benefits of medications for prevention of heart attack, stroke and kidney damage; Importance of home blood pressure monitoring; Proper BP monitoring technique; -Counseled to monitor BP at home at least five times per  week, document, and provide log at future appointments -Recommended to continue current medication Collaborated with PCP team to increase patients Valsartan from 80 mg tablet once per day to 160 mg tablet once per day.  Will recommend patient have assessment in one month to determine how well his BP has improved since this change.   Hyperlipidemia: (LDL goal < 100) -Uncontrolled -Current treatment: Atorvastatin 20 mg tablet once per day. Appropriate, Query effective -Medications previously tried: none noted   -Current dietary patterns: during the weekdays they are eating broiled fish and vegetables.Sometimes [place] sandwiches, cole slaw and tuna  fish.  -Current exercise habits: please see hypertension for more information  -Educated on Cholesterol goals;  Importance of limiting foods high in cholesterol;  -Collaborated with PCP team to increase patients Atorvastatin to 40 mg tablet daily.   Patient Goals/Self-Care Activities Patient will:  - check blood pressure at least once per day, document, and provide at future appointments - Start taking Atorvastatin 40 mg tablet once per day, and Valsartan 160 mg tablets once per day   Follow Up Plan: Telephone follow up appointment with care management team member scheduled for: 05/07/2022 at 9 AM . The patient has been provided with contact information for the care management team and has been advised to call with any health related questions or concerns.      Patient agreed to services and verbal consent obtained.   The patient verbalized understanding of instructions, educational materials, and care plan provided today and agreed to receive a mailed copy of patient instructions, educational materials, and care plan.   [name], PharmD Clinical Pharmacist [facility] [phone]

## 2022-04-05 NOTE — Chronic Care Management (AMB) (Addendum)
Chronic Care Management Pharmacy Assistant   Name: [name]  MRN: [id number] DOB: 14-Apr-1951  Reason for Encounter: Disease State/ Hypertension  Recent office visits:  None  Recent consult visits:  None  Hospital visits:  None in previous 6 months  Medications: Outpatient Encounter Medications as of 03/29/2022  Medication Sig Note   hydrochlorothiazide (HYDRODIURIL) 12.5 MG tablet Take 1 tablet (12.5 mg total) by mouth daily.    [DISCONTINUED] atorvastatin (LIPITOR) 20 MG tablet TAKE 1 TABLET BY MOUTH EVERY DAY 04/03/2022: Dose change    [DISCONTINUED] valsartan (DIOVAN) 80 MG tablet Take 1 tablet (80 mg total) by mouth daily. 04/03/2022: Dose change    No facility-administered encounter medications on file as of 03/29/2022.  Reviewed chart prior to disease state call. Spoke with patient regarding BP  Recent Office Vitals: BP Readings from Last 3 Encounters:  02/14/22 (!) 144/82  11/05/21 (!) 142/82  05/28/21 136/80   Pulse Readings from Last 3 Encounters:  02/14/22 71  11/05/21 84  05/28/21 74    Wt Readings from Last 3 Encounters:  02/14/22 220 lb (99.8 kg)  01/18/22 215 lb (97.5 kg)  11/05/21 221 lb 3.2 oz (100.3 kg)     Kidney Function Lab Results  Component Value Date/Time   CREATININE 1.19 02/14/2022 12:10 PM   CREATININE 1.16 11/05/2021 02:46 PM   GFRNONAA 63 08/21/2020 02:53 PM   GFRAA 73 08/21/2020 02:53 PM       Latest Ref Rng & Units 02/14/2022   12:10 PM 11/05/2021    2:46 PM 04/09/2021   11:43 AM  BMP  Glucose 70 - 99 mg/dL 92  107  89   BUN 8 - 27 mg/dL '18  18  15   '$ Creatinine 0.76 - 1.27 mg/dL 1.19  1.16  1.17   BUN/Creat Ratio 10 - '24 15  16  13   '$ Sodium 134 - 144 mmol/L 138  142  142   Potassium 3.5 - 5.2 mmol/L 4.9  4.6  4.9   Chloride 96 - 106 mmol/L 98  102  101   CO2 20 - 29 mmol/L '26  26  26   '$ Calcium 8.6 - 10.2 mg/dL 9.5  9.4  9.3     Current antihypertensive regimen:  HCTZ 12.5 mg daily Valsartan 160 mg daily  How  often are you checking your Blood Pressure? daily. This week every other day.  Current home BP readings: 07-24 141/78 54, 07-26 163/98 70, 07-28 166/82 79  What recent interventions/DTPs have been made by any provider to improve Blood Pressure control since last CPP Visit:  Educated on Daily salt intake goal < 2300 mg; Importance of home blood pressure monitoring; Proper BP monitoring technique; -Counseled to monitor BP at home three time per week, document, and provide log at future appointments.  Any recent hospitalizations or ED visits since last visit with CPP? No  What diet changes have been made to improve Blood Pressure Control?  Patient stated he limits salt intake and drinks plenty of water.  What exercise is being done to improve your Blood Pressure Control?  Patient stated he walks twice weekly and takes care of wife.  Adherence Review: Is the patient currently on ACE/ARB medication? Yes Does the patient have >5 day gap between last estimated fill dates? No   Care Gaps: Covid booster overdue  Star Rating Drugs: Atorvastatin 40 mg- Last filled 04-03-2022 [facility] Valsartan 160 mg- Last filled 04-03-2022 [facility]  [place] Pharmacist Assistant [phone]

## 2022-04-08 DIAGNOSIS — I10 Essential (primary) hypertension: Secondary | ICD-10-CM

## 2022-04-08 DIAGNOSIS — E785 Hyperlipidemia, unspecified: Secondary | ICD-10-CM | POA: Diagnosis not present

## 2022-04-12 ENCOUNTER — Telehealth: Payer: Self-pay

## 2022-04-12 NOTE — Chronic Care Management (AMB) (Addendum)
Chronic Care Management Pharmacy Assistant   Name: TIELER COURNOYER  MRN: [id number] DOB: 1951/05/18  Reason for Encounter: Disease State/ Hypertension  Recent office visits:  None  Recent consult visits:  None  Hospital visits:  None in previous 6 months  Medications: Outpatient Encounter Medications as of 04/12/2022  Medication Sig   atorvastatin (LIPITOR) 40 MG tablet Take 1 tablet (40 mg total) by mouth daily.   hydrochlorothiazide (HYDRODIURIL) 12.5 MG tablet Take 1 tablet (12.5 mg total) by mouth daily.   valsartan (DIOVAN) 160 MG tablet Take 1 tablet (160 mg total) by mouth daily.   No facility-administered encounter medications on file as of 04/12/2022.  Reviewed chart prior to disease state call. Spoke with patient regarding BP  Recent Office Vitals: BP Readings from Last 3 Encounters:  02/14/22 (!) 144/82  11/05/21 (!) 142/82  05/28/21 136/80   Pulse Readings from Last 3 Encounters:  02/14/22 71  11/05/21 84  05/28/21 74    Wt Readings from Last 3 Encounters:  02/14/22 220 lb (99.8 kg)  01/18/22 215 lb (97.5 kg)  11/05/21 221 lb 3.2 oz (100.3 kg)     Kidney Function Lab Results  Component Value Date/Time   CREATININE 1.19 02/14/2022 12:10 PM   CREATININE 1.16 11/05/2021 02:46 PM   GFRNONAA 63 08/21/2020 02:53 PM   GFRAA 73 08/21/2020 02:53 PM       Latest Ref Rng & Units 02/14/2022   12:10 PM 11/05/2021    2:46 PM 04/09/2021   11:43 AM  BMP  Glucose 70 - 99 mg/dL 92  107  89   BUN 8 - 27 mg/dL '18  18  15   '$ Creatinine 0.76 - 1.27 mg/dL 1.19  1.16  1.17   BUN/Creat Ratio 10 - '24 15  16  13   '$ Sodium 134 - 144 mmol/L 138  142  142   Potassium 3.5 - 5.2 mmol/L 4.9  4.6  4.9   Chloride 96 - 106 mmol/L 98  102  101   CO2 20 - 29 mmol/L '26  26  26   '$ Calcium 8.6 - 10.2 mg/dL 9.5  9.4  9.3     Current antihypertensive regimen:  HCTZ 12.5 mg daily Valsartan 160 mg daily  How often are you checking your Blood Pressure? daily. 3 days this  week.  Current home BP readings: 08-02 160/80 72, 08-03 152/81 49, 08-04 160/80 51  What recent interventions/DTPs have been made by any provider to improve Blood Pressure control since last CPP Visit:  Educated on BP goals and benefits of medications for prevention of heart attack, stroke and kidney damage; Importance of home blood pressure monitoring; Proper BP monitoring technique; -Counseled to monitor BP at home at least five times per week, document, and provide log at future appointments -Recommended to continue current medication Collaborated with PCP team to increase patients Valsartan from 80 mg tablet once per day to 160 mg tablet once per day.  Will recommend patient have assessment in one month to determine how well his BP has improved since this change.   Any recent hospitalizations or ED visits since last visit with CPP? No  What diet changes have been made to improve Blood Pressure Control?  Patient stated he limits salt intake and drinks plenty of water.  What exercise is being done to improve your Blood Pressure Control?  Patient stated he walks twice weekly and takes care of wife.  Adherence Review: Is the patient currently on  ACE/ARB medication? Yes Does the patient have >5 day gap between last estimated fill dates? No  04-19-2022: weekly BP readings: 08-07 152/80 52, 08-09 153/78 58, 08-10 155/78 50, 08-10 left arm 159/82 51 right arm 157/89 50   Care Gaps: Covid booster overdue Flu vaccine overdue  Star Rating Drugs: Atorvastatin 20 mg- Last filled Last filled 04-03-2022 [facility] Valsartan 160 mg- Last filled Last filled 04-03-2022 [facility]  [place] Clinical Pharmacist Assistant [phone]

## 2022-04-18 ENCOUNTER — Other Ambulatory Visit: Payer: Self-pay | Admitting: Nurse Practitioner

## 2022-04-18 DIAGNOSIS — Z91030 Bee allergy status: Secondary | ICD-10-CM

## 2022-04-18 MED ORDER — EPINEPHRINE 0.3 MG/0.3ML IJ SOAJ: 0.3000 mg | INTRAMUSCULAR | 2 refills | Status: DC | PRN

## 2022-04-18 NOTE — Progress Notes (Signed)
Reports he had an episode of inability to get up, vomiting and itching after stepping in a yellow jacket nest. Will send epi pen. Doing well now.

## 2022-05-03 ENCOUNTER — Telehealth: Payer: Self-pay

## 2022-05-03 NOTE — Chronic Care Management (AMB) (Cosign Needed Addendum)
Chronic Care Management Pharmacy Assistant   Name: [name]  MRN: [id number] DOB: 09-04-1951  Reason for Encounter: Disease State/  Hypertension  Recent office visits:  None  Recent consult visits:  None  Hospital visits:  None in previous 6 months  Medications: Outpatient Encounter Medications as of 05/03/2022  Medication Sig   atorvastatin (LIPITOR) 40 MG tablet Take 1 tablet (40 mg total) by mouth daily.   EPINEPHrine 0.3 mg/0.3 mL IJ SOAJ injection Inject 0.3 mg into the muscle as needed for anaphylaxis.   hydrochlorothiazide (HYDRODIURIL) 12.5 MG tablet Take 1 tablet (12.5 mg total) by mouth daily.   valsartan (DIOVAN) 160 MG tablet Take 1 tablet (160 mg total) by mouth daily.   No facility-administered encounter medications on file as of 05/03/2022.  Reviewed chart prior to disease state call. Spoke with patient regarding BP  Recent Office Vitals: BP Readings from Last 3 Encounters:  02/14/22 (!) 144/82  11/05/21 (!) 142/82  05/28/21 136/80   Pulse Readings from Last 3 Encounters:  02/14/22 71  11/05/21 84  05/28/21 74    Wt Readings from Last 3 Encounters:  02/14/22 220 lb (99.8 kg)  01/18/22 215 lb (97.5 kg)  11/05/21 221 lb 3.2 oz (100.3 kg)     Kidney Function Lab Results  Component Value Date/Time   CREATININE 1.19 02/14/2022 12:10 PM   CREATININE 1.16 11/05/2021 02:46 PM   GFRNONAA 63 08/21/2020 02:53 PM   GFRAA 73 08/21/2020 02:53 PM       Latest Ref Rng & Units 02/14/2022   12:10 PM 11/05/2021    2:46 PM 04/09/2021   11:43 AM  BMP  Glucose 70 - 99 mg/dL 92  107  89   BUN 8 - 27 mg/dL '18  18  15   '$ Creatinine 0.76 - 1.27 mg/dL 1.19  1.16  1.17   BUN/Creat Ratio 10 - '24 15  16  13   '$ Sodium 134 - 144 mmol/L 138  142  142   Potassium 3.5 - 5.2 mmol/L 4.9  4.6  4.9   Chloride 96 - 106 mmol/L 98  102  101   CO2 20 - 29 mmol/L '26  26  26   '$ Calcium 8.6 - 10.2 mg/dL 9.5  9.4  9.3     Current antihypertensive regimen:  HCTZ 12.5 mg  daily Valsartan 160 mg daily  How often are you checking your Blood Pressure? daily  Current home BP readings: 08-15 153/80 54, 08-16 168/81 65, 08-17 151/78 52, 08-18 160/79 73, 08-20 152/79 55, 08-22 80/60 92, 08-23 71/52 76, 08-24 158/93 55, 08-25 170/80 82  What recent interventions/DTPs have been made by any provider to improve Blood Pressure control since last CPP Visit:  Educated on BP goals and benefits of medications for prevention of heart attack, stroke and kidney damage; Importance of home blood pressure monitoring; Proper BP monitoring technique; -Counseled to monitor BP at home at least five times per week, document, and provide log at future appointments -Recommended to continue current medication Collaborated with PCP team to increase patients Valsartan from 80 mg tablet once per day to 160 mg tablet once per day.  Will recommend patient have assessment in one month to determine how well his BP has improved since this change.   Any recent hospitalizations or ED visits since last visit with CPP? No  What diet changes have been made to improve Blood Pressure Control?  Patient stated he limits salt intake and drinks plenty  of water.  What exercise is being done to improve your Blood Pressure Control?  Patient stated he walks twice weekly and takes care of wife. Patient also started walking 2 miles daily at [employer] this week.  Adherence Review: Is the patient currently on ACE/ARB medication? Yes Does the patient have >5 day gap between last estimated fill dates? No  NOTES: Patient reports starting [employer] on 08-22 walking 2 miles daily. Patient has been checking blood pressure later in the day and after going to the gym. Asked patient to try checking blood pressure before any type of activity. Reminded patient of appointment with [name] CPP on 05-07-2022 at 9:00.  Care Gaps: Covid booster overdue Flu vaccine overdue  Star Rating Drugs: Atorvastatin  20 mg- Last filled Last filled 04-03-2022 [facility] Valsartan 160 mg- Last filled Last filled 04-03-2022 [facility]  [place] Clinical Pharmacist Assistant [phone]

## 2022-05-07 ENCOUNTER — Ambulatory Visit (INDEPENDENT_AMBULATORY_CARE_PROVIDER_SITE_OTHER): Payer: Medicare Other

## 2022-05-07 ENCOUNTER — Other Ambulatory Visit: Payer: Self-pay | Admitting: Nurse Practitioner

## 2022-05-07 DIAGNOSIS — I10 Essential (primary) hypertension: Secondary | ICD-10-CM

## 2022-05-07 DIAGNOSIS — E785 Hyperlipidemia, unspecified: Secondary | ICD-10-CM

## 2022-05-07 DIAGNOSIS — E781 Pure hyperglyceridemia: Secondary | ICD-10-CM

## 2022-05-07 MED ORDER — AMLODIPINE BESYLATE 5 MG PO TABS: 5.0000 mg | ORAL_TABLET | Freq: Every day | ORAL | 0 refills | Status: DC

## 2022-05-07 NOTE — Progress Notes (Addendum)
Chronic Care Management Pharmacy Note  05/07/2022 Name:  [name] MRN:  [id number] DOB:  04/20/1951  Summary: Patient reports that he is still checking his BP five days per week, and his BP has come down some. Patient reports taking his medication daily, and wanted to know more information about him taking Vitamin D and why it is not on his medication list although he is taking it   Recommendations/Changes made from today's visit: Recommend patient be started on Amlodipine 5 mg tablet  Recommend patient have Vitamin D and thyroid labs completed at next visit due to consistent elevated triglycerides Recommend microalbuminuria test completed  Recommended [name] use 7 day AM/PM medication reminder system  Recommend patient possible diagnosis of elevated triglycerides.   Plan: [name] is going to take his Valsartan and HCTZ in the morning, and Amlodipine 5 mg at night  Continue to walk daily  HC to reach out to [name] daily for the next three days to see how [name] is doing with his new BP regimen, and then to start back to weekly calls.  [name] is going to purchase 7 day medication reminder system that has AM/PM [facility].    Subjective: [name] is an 71 y.o. year old male who is a primary patient of [name], Villal[place].  The CCM team was consulted for assistance with disease management and care coordination needs.    Engaged with patient by telephone for follow up visit in response to provider referral for pharmacy case management and/or care coordination services.   Consent to Services:  The patient was given information about Chronic Care Management services, agreed to services, and gave verbal consent prior to initiation of services.  Please see initial visit note for detailed documentation.   Patient Care Team: Jose Brine, FNP as PCP - General (General Practice) [name], [facility] (Pharmacist)  Recent office visits: 02/14/2022 PCP  OV  Recent consult visits: No recent consults   Hospital visits: None in previous 6 months   Objective:  Lab Results  Component Value Date   CREATININE 1.19 02/14/2022   BUN 18 02/14/2022   EGFR 65 02/14/2022   GFRNONAA 63 08/21/2020   GFRAA 73 08/21/2020   NA 138 02/14/2022   K 4.9 02/14/2022   CALCIUM 9.5 02/14/2022   CO2 26 02/14/2022   GLUCOSE 92 02/14/2022    Lab Results  Component Value Date/Time   HGBA1C 5.1 08/21/2020 02:53 PM   HGBA1C 4.8 08/04/2019 10:13 AM   MICROALBUR 150 08/21/2020 12:57 PM    Last diabetic Eye exam: No results found for: "HMDIABEYEEXA"  Last diabetic Foot exam: No results found for: "HMDIABFOOTEX"   Lab Results  Component Value Date   CHOL 185 02/14/2022   HDL 43 02/14/2022   LDLCALC 100 (H) 02/14/2022   TRIG 244 (H) 02/14/2022   CHOLHDL 4.3 02/14/2022       Latest Ref Rng & Units 02/14/2022   12:10 PM 11/14/2020   12:48 PM 08/21/2020    2:53 PM  Hepatic Function  Total Protein 6.0 - 8.5 g/dL 7.5  7.2  7.0   Albumin 3.7 - 4.7 g/dL 4.5  4.3  4.0   AST 0 - 40 IU/L '20  21  22   ' ALT 0 - 44 IU/L '24  27  29   ' Alk Phosphatase 44 - 121 IU/L 76  78  78   Total Bilirubin 0.0 - 1.2 mg/dL 0.7  0.5  0.6     Lab Results  Component Value Date/Time   TSH 1.660 08/04/2019 10:13 AM       Latest Ref Rng & Units 08/21/2020    2:53 PM 04/24/2009   11:13 AM 01/22/2008    9:05 AM  CBC  WBC 3.4 - 10.8 x10E3/uL 5.1   4.7   Hemoglobin 13.0 - 17.7 g/dL 15.8  17.0  11.9   Hematocrit 37.5 - 51.0 % 46.9   35.0   Platelets 150 - 450 x10E3/uL 148   179     No results found for: "VD25OH"  Clinical ASCVD: No  The 10-year ASCVD risk score (Arnett DK, et al., 2019) is: 24.4%   Values used to calculate the score:     Age: 71 years     Sex: Male     Is Non-Hispanic African American: Yes     Diabetic: No     Tobacco smoker: No     Systolic Blood Pressure: 627 mmHg     Is BP treated: Yes     HDL Cholesterol: 43 mg/dL     Total Cholesterol: 185  mg/dL       01/18/2022    9:34 AM 11/05/2021    2:10 PM 08/21/2020    9:49 AM  Depression screen PHQ 2/9  Decreased Interest 0 0 0  Down, Depressed, Hopeless 0 0 0  PHQ - 2 Score 0 0 0  Altered sleeping  0   Tired, decreased energy  0   Change in appetite  0   Feeling bad or failure about yourself   0   Trouble concentrating  0   Moving slowly or fidgety/restless  0   Suicidal thoughts  0   PHQ-9 Score  0       Social History   Tobacco Use  Smoking Status Never  Smokeless Tobacco Never   BP Readings from Last 3 Encounters:  02/14/22 (!) 144/82  11/05/21 (!) 142/82  05/28/21 136/80   Pulse Readings from Last 3 Encounters:  02/14/22 71  11/05/21 84  05/28/21 74   Wt Readings from Last 3 Encounters:  02/14/22 220 lb (99.8 kg)  01/18/22 215 lb (97.5 kg)  11/05/21 221 lb 3.2 oz (100.3 kg)   BMI Readings from Last 3 Encounters:  02/14/22 34.46 kg/m  01/18/22 33.67 kg/m  11/05/21 37.27 kg/m    Assessment/Interventions: Review of patient past medical history, allergies, medications, health status, including review of consultants reports, laboratory and other test data, was performed as part of comprehensive evaluation and provision of chronic care management services.   SDOH:  (Social Determinants of Health) assessments and interventions performed: No  SDOH Screenings   Alcohol Screen: Not on file  Depression (PHQ2-9): Low Risk  (01/18/2022)   Depression (PHQ2-9)    PHQ-2 Score: 0  Financial Resource Strain: Low Risk  (01/18/2022)   Overall Financial Resource Strain (CARDIA)    Difficulty of Paying Living Expenses: Not hard at all  Food Insecurity: No Food Insecurity (01/18/2022)   Hunger Vital Sign    Worried About Running Out of Food in the Last Year: Never true    Jose Out of Food in the Last Year: Never true  Housing: Not on file  Physical Activity: Insufficiently Active (01/18/2022)   Exercise Vital Sign    Days of Exercise per Week: 2 days    Minutes of  Exercise per Session: 60 min  Social Connections: Unknown (08/04/2019)   Social Connection and Isolation  Panel [NHANES]    Frequency of Communication with Friends and Family: Patient refused    Frequency of Social Gatherings with Friends and Family: Patient refused    Attends Religious Services: Patient refused    Active Member of Clubs or Organizations: Patient refused    Attends Archivist Meetings: Patient refused    Marital Status: Patient refused  Stress: Stress Concern Present (01/18/2022)   [place]    Feeling of Stress : To some extent  Tobacco Use: Low Risk  (02/14/2022)   Patient History    Smoking Tobacco Use: Never    Smokeless Tobacco Use: Never    Passive Exposure: Not on file  Transportation Needs: No Transportation Needs (01/18/2022)   PRAPARE - Transportation    Lack of Transportation (Medical): No    Lack of Transportation (Non-Medical): No    CCM Care Plan  No Known Allergies  Medications Reviewed Today     Reviewed by [name], RPH (Pharmacist) on 05/07/22 at 1002  Med List Status: <None>   Medication Order Taking? Sig Documenting Provider Last Dose Status Informant  amLODipine (NORVASC) 5 MG tablet [id number] Yes Take 1 tablet (5 mg total) by mouth at bedtime. Jose Brine, FNP  Active   atorvastatin (LIPITOR) 40 MG tablet [id number]  Take 1 tablet (40 mg total) by mouth daily. Jose Brine, FNP  Active   EPINEPHrine 0.3 mg/0.3 mL IJ SOAJ injection [id number]  Inject 0.3 mg into the muscle as needed for anaphylaxis. Jose Brine, FNP  Active   hydrochlorothiazide (HYDRODIURIL) 12.5 MG tablet [id number] No Take 1 tablet (12.5 mg total) by mouth daily. Jose Brine, FNP Taking Expired 02/14/22 2359   valsartan (DIOVAN) 160 MG tablet [id number]  Take 1 tablet (160 mg total) by mouth daily. Jose Brine, FNP  Active             Patient Active Problem List   Diagnosis Date  Noted   Essential hypertension 08/21/2020   Elevated cholesterol 08/21/2020    Immunization History  Administered Date(s) Administered   Fluad Quad(high Dose 65+) 08/16/2020, 05/28/2021   Influenza, High Dose Seasonal PF 07/07/2019   PFIZER Comirnaty(Gray Top)Covid-19 Tri-Sucrose Vaccine 03/16/2021   PFIZER(Purple Top)SARS-COV-2 Vaccination 01/14/2020, 02/08/2020, 09/15/2020   PNEUMOCOCCAL CONJUGATE-20 05/31/2021   Tdap 10/12/2019   Zoster Recombinat (Shingrix) 03/16/2021, 06/01/2021    Conditions to be addressed/monitored:  Hypertension and Hyperlipidemia  Care Plan : [place]  Updates made by [name], RPH since 05/07/2022 12:00 AM     Problem: HTN, HLD      Long-Range Goal: Patient Stated   Recent Progress: On track  Priority: High  Note:   Current Barriers:  Unable to independently monitor therapeutic efficacy Unable to achieve control of hypertension    Pharmacist Clinical Goal(s):  Patient will achieve control of cholesterol as evidenced by LDL and triglycerides  achieve improvement in hypertension as evidenced by decrease in BP through collaboration with PharmD and provider.   Interventions: 1:1 collaboration with Jose Brine, FNP regarding development and update of comprehensive plan of care as evidenced by provider attestation and co-signature Inter-disciplinary care team collaboration (see longitudinal plan of care) Comprehensive medication review performed; medication list updated in electronic medical record  Hypertension (BP goal <130/80) -Uncontrolled -Current treatment: Valsartan 160 mg tablet once per day in the evening Appropriate, Query effective Hydrochlorothiazide 12.5 mg tablet once per day in the afternoon Appropriate, Query effective -Current home readings: 04/25/22-  151/85   -Current dietary habits: eating mostly broiled foods and eating plenty of vegetables -Current exercise habits: he is walking five days per week at 6  am Monday through Friday, he started this regimen last week Tuesday on 04/30/2022  -Denies hypotensive/hypertensive symptoms -Educated on BP goals and benefits of medications for prevention of heart attack, stroke and kidney damage; Exercise goal of 150 minutes per week; Importance of home blood pressure monitoring; -Counseled to monitor BP at home at least five times per week , document, and provide log at future appointments -Collaborated with PCP to add Amlodipine 5 mg tablet once per day in the evening. Patient is comfortable with this approach and verbalized understanding.   Hyperlipidemia: (LDL goal < 70) -Uncontrolled -Current treatment: Atorvastatin 40 mg tablet once per day. Appropriate, Query effective -Current dietary patterns: eating plenty of vegetables  -Current exercise habits: continue walking daily  -Educated on Cholesterol goals;  Benefits of statin for ASCVD risk reduction; Importance of limiting foods high in cholesterol; -Recommended to continue current medication Collaborated with PCP to increase patients to Atorvastatin 80 mg tablet daily  -Collaborate with PCP to check Vitamin D panel  and thyroid panel to be completed at next visit with PCP team, due to triglyceride elevation.   Health Maintenance -Vaccine gaps: Influenza, COVID-19 Booster  -Medication Reminder System:  Recommended patient invest in medication reminder system that is a 7 day AM/PM system.  - When we reviewed his medications Mr. Majchrzak let me know that he was accidentally taking Ms. Ingrams Vitamin D twice per week for a couple of weeks  -We discussed in great detail the importance of medication reminder systems and how it will allow him to keep his medication organized  -Patient is satisfied with current therapy and denies issues -Educated on the medication reminder system    Patient Goals/Self-Care Activities Patient will:  - take medications as prescribed as evidenced by patient report and  record review focus on medication adherence by taking medication at the same time each day   Follow Up Plan: The patient has been provided with contact information for the care management team and has been advised to call with any health related questions or concerns.       Medication Assistance: None required.  Patient affirms current coverage meets needs.  Compliance/Adherence/Medication fill history: Care Gaps: COVID-19 Vaccine booster Influenza vacciner   Star-Rating Drugs: Valsartan 160 mg tablet  Hydrochlorothiazide 12.5 mg tablet   [id number]: [phone]: [phone] [address] [address]Phone: [phone]: [phone] Uses pill box? No - patient to start using pill box today  Pt endorses 98% compliance  We discussed: Current pharmacy is preferred with insurance plan and patient is satisfied with pharmacy services Patient decided to: Continue current medication management strategy  Care Plan and Follow Up Patient Decision:  Patient agrees to Care Plan and Follow-up.  Plan: The patient has been provided with contact information for the care management team and has been advised to call with any health related questions or concerns.   VOrlando Penner CPP, PharmD Clinical Pharmacist Practitioner [facility] [phone]

## 2022-05-07 NOTE — Patient Instructions (Addendum)
Visit Information It was great speaking with you today!  Please let me know if you have any questions about our visit.   Goals Addressed             This Visit's Progress    Manage My Medicine       Timeframe:  Long-Range Goal Priority:  High Start Date:                             Expected End Date:    07/2022                   Follow Up Date 06/28/2022   In Progress - keep a list of all the medicines I take; vitamins and herbals too - use a pillbox to sort medicine - use an alarm clock or phone to remind me to take my medicine    Why is this important?   These steps will help you keep on track with your medicines.        Track and Manage My Blood Pressure-Hypertension       Timeframe:  Long-Range Goal Priority:  High Start Date:                             Expected End Date:                       Follow Up Date 06/28/2022   In Progress:   - check blood pressure 5 times per week - write blood pressure results in a log or diary  -Please START taking Amlodipine 5 mg tablet at bedtime and CONTINUE TO TAKE: Valsartan 160 mg tablet and HCTZ 12.5 MG tablet in the morning   Why is this important?   You won't feel high blood pressure, but it can still hurt your blood vessels.  High blood pressure can cause heart or kidney problems. It can also cause a stroke.  Making lifestyle changes like losing a little weight or eating less salt will help.  Checking your blood pressure at home and at different times of the day can help to control blood pressure.  If the doctor prescribes medicine remember to take it the way the doctor ordered.  Call the office if you cannot afford the medicine or if there are questions about it.     Notes:  -Please let us know if you have any questions         Patient Care Plan: CCM Pharmacy Care Plan     Problem Identified: HTN, HLD      Long-Range Goal: Patient Stated   Recent Progress: On track  Priority: High  Note:   Current  Barriers:  Unable to independently monitor therapeutic efficacy Unable to achieve control of hypertension    Pharmacist Clinical Goal(s):  Patient will achieve control of cholesterol as evidenced by LDL and triglycerides  achieve improvement in hypertension as evidenced by decrease in BP through collaboration with PharmD and provider.   Interventions: 1:1 collaboration with [name], FNP regarding development and update of comprehensive plan of care as evidenced by provider attestation and co-signature Inter-disciplinary care team collaboration (see longitudinal plan of care) Comprehensive medication review performed; medication list updated in electronic medical record  Hypertension (BP goal <130/80) -Uncontrolled -Current treatment: Valsartan 160 mg tablet once per day in the evening Appropriate, Query effective Hydrochlorothiazide 12.5 mg tablet  once per day in the afternoon Appropriate, Query effective -Current home readings: 04/25/22- 151/85   -Current dietary habits: eating mostly broiled foods and eating plenty of vegetables -Current exercise habits: he is walking five days per week at 6 am Monday through Friday, he started this regimen last week Tuesday on 04/30/2022  -Denies hypotensive/hypertensive symptoms -Educated on BP goals and benefits of medications for prevention of heart attack, stroke and kidney damage; Exercise goal of 150 minutes per week; Importance of home blood pressure monitoring; -Counseled to monitor BP at home at least five times per week , document, and provide log at future appointments -Collaborated with PCP to add Amlodipine 5 mg tablet once per day in the evening. Patient is comfortable with this approach and verbalized understanding.   Hyperlipidemia: (LDL goal < 70) -Uncontrolled -Current treatment: Atorvastatin 40 mg tablet once per day. Appropriate, Query effective -Current dietary patterns: eating plenty of vegetables  -Current exercise  habits: continue walking daily  -Educated on Cholesterol goals;  Benefits of statin for ASCVD risk reduction; Importance of limiting foods high in cholesterol; -Recommended to continue current medication Collaborated with PCP to increase patients to Atorvastatin 80 mg tablet daily  -Collaborate with PCP to check Vitamin D panel  and thyroid panel to be completed at next visit with PCP team, due to triglyceride elevation.   Health Maintenance -Vaccine gaps: Influenza, COVID-19 Booster  -Medication Reminder System:  Recommended patient invest in medication reminder system that is a 7 day AM/PM system.  - When we reviewed his medications Mr. Dacosta let me know that he was accidentally taking Ms. Ingrams Vitamin D twice per week for a couple of weeks  -We discussed in great detail the importance of medication reminder systems and how it will allow him to keep his medication organized  -Patient is satisfied with current therapy and denies issues -Educated on the medication reminder system    Patient Goals/Self-Care Activities Patient will:  - take medications as prescribed as evidenced by patient report and record review focus on medication adherence by taking medication at the same time each day   Follow Up Plan: The patient has been provided with contact information for the care management team and has been advised to call with any health related questions or concerns.      Patient agreed to services and verbal consent obtained.   The patient verbalized understanding of instructions, educational materials, and care plan provided today and agreed to receive a mailed copy of patient instructions, educational materials, and care plan.   [name], PharmD Clinical Pharmacist [facility] [phone]

## 2022-05-09 DIAGNOSIS — E785 Hyperlipidemia, unspecified: Secondary | ICD-10-CM | POA: Diagnosis not present

## 2022-05-09 DIAGNOSIS — I10 Essential (primary) hypertension: Secondary | ICD-10-CM | POA: Diagnosis not present

## 2022-05-10 ENCOUNTER — Telehealth: Payer: Self-pay

## 2022-05-10 MED ORDER — VALSARTAN-HYDROCHLOROTHIAZIDE 160-12.5 MG PO TABS: 1.0000 | ORAL_TABLET | Freq: Every day | ORAL | 0 refills | Status: DC

## 2022-05-10 NOTE — Telephone Encounter (Signed)
Patient has been having compliance issues with medication. HC followed up with patient and discovered that [name] has not been taking his HCTZ daily, due to being out of refills. We are going to see if he qualifies for [facility] packaging to help with adherence. Going to use combo medication for Valsartan-HCTZ, for now will not send in Atorvastatin 80 mg tablet to prevent patient confusion.   [name], CPP, PharmD Clinical Pharmacist Practitioner [facility] [phone]

## 2022-05-10 NOTE — Chronic Care Management (AMB) (Signed)
Chronic Care Management Pharmacy Assistant   Name: [name]  MRN: [id number] DOB: 1950-10-19  Reason for Encounter: Disease State/ Hypertension   Recent office visits:  None  Recent consult visits:  None  Hospital visits:  None in previous 6 months  Medications: Outpatient Encounter Medications as of 05/10/2022  Medication Sig   amLODipine (NORVASC) 5 MG tablet Take 1 tablet (5 mg total) by mouth at bedtime.   atorvastatin (LIPITOR) 40 MG tablet Take 1 tablet (40 mg total) by mouth daily.   EPINEPHrine 0.3 mg/0.3 mL IJ SOAJ injection Inject 0.3 mg into the muscle as needed for anaphylaxis.   hydrochlorothiazide (HYDRODIURIL) 12.5 MG tablet Take 1 tablet (12.5 mg total) by mouth daily.   valsartan (DIOVAN) 160 MG tablet Take 1 tablet (160 mg total) by mouth daily.   No facility-administered encounter medications on file as of 05/10/2022.  Reviewed chart prior to disease state call. Spoke with patient regarding BP  Recent Office Vitals: BP Readings from Last 3 Encounters:  02/14/22 (!) 144/82  11/05/21 (!) 142/82  05/28/21 136/80   Pulse Readings from Last 3 Encounters:  02/14/22 71  11/05/21 84  05/28/21 74    Wt Readings from Last 3 Encounters:  02/14/22 220 lb (99.8 kg)  01/18/22 215 lb (97.5 kg)  11/05/21 221 lb 3.2 oz (100.3 kg)     Kidney Function Lab Results  Component Value Date/Time   CREATININE 1.19 02/14/2022 12:10 PM   CREATININE 1.16 11/05/2021 02:46 PM   GFRNONAA 63 08/21/2020 02:53 PM   GFRAA 73 08/21/2020 02:53 PM       Latest Ref Rng & Units 02/14/2022   12:10 PM 11/05/2021    2:46 PM 04/09/2021   11:43 AM  BMP  Glucose 70 - 99 mg/dL 92  107  89   BUN 8 - 27 mg/dL '18  18  15   '$ Creatinine 0.76 - 1.27 mg/dL 1.19  1.16  1.17   BUN/Creat Ratio 10 - '24 15  16  13   '$ Sodium 134 - 144 mmol/L 138  142  142   Potassium 3.5 - 5.2 mmol/L 4.9  4.6  4.9   Chloride 96 - 106 mmol/L 98  102  101   CO2 20 - 29 mmol/L '26  26  26   '$ Calcium 8.6 -  10.2 mg/dL 9.5  9.4  9.3     Current antihypertensive regimen:  HCTZ 12.5 mg daily (Hasn't taken since July Valsartan 160 mg daily Amlodipine 5 mg daily (Hasn't started will pick up today)  How often are you checking your Blood Pressure? daily  Current home BP readings: 08-28 157/77, 08-29 158/89, 08-30 160/70, 08-31 160/78, 09-01 162/78  What recent interventions/DTPs have been made by any provider to improve Blood Pressure control since last CPP Visit:  Educated on BP goals and benefits of medications for prevention of heart attack, stroke and kidney damage; Exercise goal of 150 minutes per week; Importance of home blood pressure monitoring; -Counseled to monitor BP at home at least five times per week , document, and provide log at future appointments -Collaborated with PCP to add Amlodipine 5 mg tablet once per day in the evening. Patient is comfortable with this approach and verbalized understanding.   Any recent hospitalizations or ED visits since last visit with CPP? No  What diet changes have been made to improve Blood Pressure Control?  Patient stated he limits salt intake and drinks plenty of water.  What exercise  is being done to improve your Blood Pressure Control?  Patient stated he walks twice weekly and takes care of wife. Patient also started walking 2 miles daily at [employer] this week.  Adherence Review: Is the patient currently on ACE/ARB medication? Yes Does the patient have >5 day gap between last estimated fill dates? No  NOTES: Patient stated a medication was increased by [name] but couldn't think of the medication name. Confirmed with [name] that Atorvastatin 40 mg was increased to 80 mg and he is to start amlodipine. Patient hasn't picked amlodipine up from pharmacy but will today. Asked patient if he could go over the medications he has on hand and patient didn't name HCTZ. [name] is sending atorvastatin 80 mg and HCTZ 12.5 mg  [facility]. After further discussing patinet's plan with [name] she has decided to not send in atorvastatin 80 mg and to start Valsartan/HCTZ 30 DS and stop valsartan since patient isn't compliant with meds. Will do benefits investigation to transfer patient's pharmacy to [facility] per [name]. 09-07 confirmed patient picked up valsartan/HCTZ, discard valsartan. Patinet confirmed only taking amlodipine 5 mg, Atrorvastatin 40 mg and valsartan/HCTZ.  Care Gaps: Covid booster overdue Flu vaccine overdue  Star Rating Drugs: Atorvastatin 20 mg- Last filled [facility]-03-2022 [facility] Valsartan 160 mg- Last filled Last 04-03-2022 [facility]  [place] Clinical Pharmacist Assistant [phone]

## 2022-05-17 ENCOUNTER — Telehealth: Payer: Self-pay

## 2022-05-17 NOTE — Chronic Care Management (AMB) (Addendum)
[name]   Name: [name]  MRN: [id number] DOB: Jan 26, 1951  Reason for Encounter: Disease State/ Hypertension  Recent office visits:  None  Recent consult visits:  None  Hospital visits:  None in previous 6 months  Medications: Outpatient Encounter Medications as of 05/17/2022  Medication Sig   amLODipine (NORVASC) 5 MG tablet Take 1 tablet (5 mg total) by mouth at bedtime.   atorvastatin (LIPITOR) 40 MG tablet Take 1 tablet (40 mg total) by mouth daily.   EPINEPHrine 0.3 mg/0.3 mL IJ SOAJ injection Inject 0.3 mg into the muscle as needed for anaphylaxis.   valsartan-hydrochlorothiazide (DIOVAN-HCT) 160-12.5 MG tablet Take 1 tablet by mouth daily.   No facility-administered encounter medications on file as of 05/17/2022.  Reviewed chart prior to disease state call. Spoke with patient regarding BP  Recent Office Vitals: BP Readings from Last 3 Encounters:  02/14/22 (!) 144/82  11/05/21 (!) 142/82  05/28/21 136/80   Pulse Readings from Last 3 Encounters:  02/14/22 71  11/05/21 84  05/28/21 74    Wt Readings from Last 3 Encounters:  02/14/22 220 lb (99.8 kg)  01/18/22 215 lb (97.5 kg)  11/05/21 221 lb 3.2 oz (100.3 kg)     Kidney Function Lab Results  Component Value Date/Time   CREATININE 1.19 02/14/2022 12:10 PM   CREATININE 1.16 11/05/2021 02:46 PM   GFRNONAA 63 08/21/2020 02:53 PM   GFRAA 73 08/21/2020 02:53 PM       Latest Ref Rng & Units 02/14/2022   12:10 PM 11/05/2021    2:46 PM 04/09/2021   11:43 AM  BMP  Glucose 70 - 99 mg/dL 92  107  89   BUN 8 - 27 mg/dL '18  18  15   '$ Creatinine 0.76 - 1.27 mg/dL 1.19  1.16  1.17   BUN/Creat Ratio 10 - '24 15  16  13   '$ Sodium 134 - 144 mmol/L 138  142  142   Potassium 3.5 - 5.2 mmol/L 4.9  4.6  4.9   Chloride 96 - 106 mmol/L 98  102  101   CO2 20 - 29 mmol/L '26  26  26   '$ Calcium 8.6 - 10.2 mg/dL 9.5  9.4  9.3     Current antihypertensive regimen:  Amlodipine 5 mg  daily Valsartan/HCTZ 160-12.5 mg daily  How often are you checking your Blood Pressure? daily  Current home BP readings: 09-05 163/78, 09-06 150/80, 09-07 143/78, 09-08 127/65.   What recent interventions/DTPs have been made by any provider to improve Blood Pressure control since last CPP Visit:  Educated on BP goals and benefits of medications for prevention of heart attack, stroke and kidney damage; Exercise goal of 150 minutes per week; Importance of home blood pressure monitoring; -Counseled to monitor BP at home at least five times per week , document, and provide log at future appointments -Collaborated with PCP to add Amlodipine 5 mg tablet once per day in the evening. Patient is comfortable with this approach and verbalized understanding.   Any recent hospitalizations or ED visits since last visit with CPP? No  What diet changes have been made to improve Blood Pressure Control?  Patient stated he limits salt intake and drinks plenty of water.  What exercise is being done to improve your Blood Pressure Control?  Patient stated he goes [facility] [employer] every morning to walk 2 miles. Patient also takes care of wife daily.  Adherence Review: Is the  patient currently on ACE/ARB medication? Yes Does the patient have >5 day gap between last estimated fill dates? No  NOTES: Patient confirmed checking blood pressure before any daily activities.  Care Gaps: Covid booster overdue Flu vaccine overdue  Star Rating Drugs: Atorvastatin 40 mg- Last filled [facility]-03-2022 [facility] Valsartan/HCTZ 160-12.5 mg- Last filled 05-10-2022 [facility]  [place] Clinical Pharmacist Kirk [phone]

## 2022-05-20 ENCOUNTER — Encounter: Payer: Self-pay | Admitting: Nurse Practitioner

## 2022-05-20 ENCOUNTER — Ambulatory Visit (INDEPENDENT_AMBULATORY_CARE_PROVIDER_SITE_OTHER): Payer: Medicare Other | Admitting: Nurse Practitioner

## 2022-05-20 ENCOUNTER — Other Ambulatory Visit: Payer: Self-pay | Admitting: Nurse Practitioner

## 2022-05-20 VITALS — BP 138/78 | HR 93 | Temp 98.2°F | Ht 67.0 in | Wt 212.0 lb

## 2022-05-20 DIAGNOSIS — E559 Vitamin D deficiency, unspecified: Secondary | ICD-10-CM | POA: Diagnosis not present

## 2022-05-20 DIAGNOSIS — Z6833 Body mass index (BMI) 33.0-33.9, adult: Secondary | ICD-10-CM

## 2022-05-20 DIAGNOSIS — Z79899 Other long term (current) drug therapy: Secondary | ICD-10-CM

## 2022-05-20 DIAGNOSIS — I10 Essential (primary) hypertension: Secondary | ICD-10-CM

## 2022-05-20 DIAGNOSIS — E6609 Other obesity due to excess calories: Secondary | ICD-10-CM

## 2022-05-20 DIAGNOSIS — E785 Hyperlipidemia, unspecified: Secondary | ICD-10-CM | POA: Diagnosis not present

## 2022-05-20 NOTE — Patient Instructions (Signed)

## 2022-05-20 NOTE — Progress Notes (Signed)
I,Tianna Badgett,acting as a Education administrator for [employer], FNP.,have documented all relevant documentation on the behalf of [name], FNP,as directed by  [name], FNP while in the presence of [name], [place].  Subjective:     Patient ID: [name] , male    DOB: 25-Apr-1951 , 71 y.o.   MRN: [id number]   Chief Complaint  Patient presents with   Hypertension    HPI  Patient presents today for a blood pressure f/u.he had not been taking his HCTZ daily until speaking with Vallie due to not having any refills. He is walking daily for about one hour. He is cutting down what he eats, only eating once a day. Reports not "hungry". He is eating tuna fish  Wt Readings from Last 3 Encounters: 05/20/22 : 212 lb (96.2 kg) 02/14/22 : 220 lb (99.8 kg) 01/18/22 : 215 lb (97.5 kg)    Hypertension This is a chronic problem. The current episode started more than 1 year ago. The problem has been gradually improving since onset. The problem is controlled. Pertinent negatives include no anxiety, chest pain, headaches, palpitations or shortness of breath. There are no associated agents to hypertension. Risk factors for coronary artery disease include sedentary lifestyle and obesity. Past treatments include diuretics and angiotensin blockers. There are no compliance problems.  There is no history of angina. There is no history of chronic renal disease.     Past Medical History:  Diagnosis Date   Hypertension      Family History  Problem Relation Age of Onset   Dementia Father    Colon cancer Neg Hx    Colon polyps Neg Hx    Esophageal cancer Neg Hx    Rectal cancer Neg Hx    Stomach cancer Neg Hx      Current Outpatient Medications:    amLODipine (NORVASC) 5 MG tablet, Take 1 tablet (5 mg total) by mouth at bedtime., Disp: 30 tablet, Rfl: 0   atorvastatin (LIPITOR) 40 MG tablet, TAKE 1 TABLET BY MOUTH EVERY DAY, Disp: 90 tablet, Rfl: 0   EPINEPHrine 0.3 mg/0.3 mL IJ SOAJ injection,  Inject 0.3 mg into the muscle as needed for anaphylaxis., Disp: 1 each, Rfl: 2   valsartan-hydrochlorothiazide (DIOVAN-HCT) 160-12.5 MG tablet, Take 1 tablet by mouth daily., Disp: 30 tablet, Rfl: 0   Vitamin D, Ergocalciferol, (DRISDOL) 1.25 MG (50000 UNIT) CAPS capsule, Take 1 capsule (50,000 Units total) by mouth 2 (two) times a week., Disp: 24 capsule, Rfl: 1   No Known Allergies   Review of Systems  Constitutional: Negative.   Respiratory: Negative.  Negative for shortness of breath.   Cardiovascular: Negative.  Negative for chest pain and palpitations.  Gastrointestinal: Negative.   Neurological: Negative.  Negative for headaches.  Psychiatric/Behavioral: Negative.       Today's Vitals   05/20/22 1200  BP: 138/78  Pulse: 93  Temp: 98.2 F (36.8 C)  TempSrc: Oral  Weight: 212 lb (96.2 kg)  Height: _0  (1.702 m)   Body mass index is 33.2 kg/m.  Wt Readings from Last 3 Encounters:  05/21/22 212 lb (96.2 kg)  05/20/22 212 lb (96.2 kg)  02/14/22 220 lb (99.8 kg)    Objective:  Physical Exam Vitals reviewed.  Constitutional:      General: He is not in acute distress.    Appearance: Normal appearance. He is obese.  Cardiovascular:     Rate and Rhythm: Normal rate and regular rhythm.     Pulses:  Normal pulses.     Heart sounds: Normal heart sounds. No murmur heard. Pulmonary:     Effort: Pulmonary effort is normal. No respiratory distress.     Breath sounds: Normal breath sounds. No wheezing.  Skin:    General: Skin is warm and dry.     Capillary Refill: Capillary refill takes less than 2 seconds.     Coloration: Skin is not jaundiced.     Findings: No bruising.  Neurological:     General: No focal deficit present.     Mental Status: He is alert and oriented to person, place, and time.     Cranial Nerves: No cranial nerve deficit.     Motor: No weakness.  Psychiatric:        Mood and Affect: Mood normal.        Behavior: Behavior normal.        Thought  Content: Thought content normal.        Judgment: Judgment normal.         Assessment And Plan:     1. Essential hypertension Comments: Blood pressure is fairly controlled, doing well with medications. Will check eGFR - Microalbumin / Creatinine Urine Ratio - BMP8+eGFR  2. Dyslipidemia Comments: He has started a statin and tolerating well.  - Thyroid Panel With TSH  3. Vitamin D deficiency Comments: Will check vitamin d due to elevated Triglyceride - VITAMIN D 25 Hydroxy (Vit-D Deficiency, Fractures)  4. Class 1 obesity due to excess calories with serious comorbidity and body mass index (BMI) of 33.0 to 33.9 in adult  5. Other long term (current) drug therapy - VITAMIN D 25 Hydroxy (Vit-D Deficiency, Fractures)  He is encouraged to initially strive for BMI less than 30 to decrease cardiac risk. He is advised to exercise no less than 150 minutes per week.     Patient was given opportunity to ask questions. Patient verbalized understanding of the plan and was able to repeat key elements of the plan. All questions were answered to their satisfaction.  [name], FNP   I, [name], FNP, have reviewed all documentation for this visit. The documentation on 05/20/22 for the exam, diagnosis, procedures, and orders are all accurate and complete.   IF YOU HAVE BEEN REFERRED TO A SPECIALIST, IT MAY TAKE 1-2 WEEKS TO SCHEDULE/PROCESS THE REFERRAL. IF YOU HAVE NOT HEARD FROM US/SPECIALIST IN TWO WEEKS, PLEASE GIVE [place] A CALL AT [phone].   THE PATIENT IS ENCOURAGED TO PRACTICE SOCIAL DISTANCING DUE TO THE COVID-19 PANDEMIC.

## 2022-05-21 ENCOUNTER — Ambulatory Visit (INDEPENDENT_AMBULATORY_CARE_PROVIDER_SITE_OTHER): Payer: Medicare Other

## 2022-05-21 VITALS — BP 120/80 | HR 79 | Temp 98.6°F | Ht 67.0 in | Wt 212.0 lb

## 2022-05-21 DIAGNOSIS — Z23 Encounter for immunization: Secondary | ICD-10-CM | POA: Diagnosis not present

## 2022-05-21 LAB — BMP8+EGFR
BUN/Creatinine Ratio: 23 (ref 10–24)
BUN: 27 mg/dL (ref 8–27)
CO2: 24 mmol/L (ref 20–29)
Calcium: 9.6 mg/dL (ref 8.6–10.2)
Chloride: 103 mmol/L (ref 96–106)
Creatinine, Ser: 1.19 mg/dL (ref 0.76–1.27)
Glucose: 95 mg/dL (ref 70–99)
Potassium: 4.5 mmol/L (ref 3.5–5.2)
Sodium: 138 mmol/L (ref 134–144)
eGFR: 65 mL/min/{1.73_m2} (ref >59)

## 2022-05-21 LAB — THYROID PANEL WITH TSH
Free Thyroxine Index: 1.7 (ref 1.2–4.9)
T3 Uptake Ratio: 25 % (ref 24–39)
T4, Total: 6.6 ug/dL (ref 4.5–12.0)
TSH: 1.88 u[IU]/mL (ref 0.450–4.500)

## 2022-05-21 LAB — MICROALBUMIN / CREATININE URINE RATIO
Creatinine, Urine: 179.2 mg/dL
Microalb/Creat Ratio: 39 mg/g creat — ABNORMAL HIGH (ref 0–29)
Microalbumin, Urine: 69.4 ug/mL

## 2022-05-21 LAB — VITAMIN D 25 HYDROXY (VIT D DEFICIENCY, FRACTURES): Vit D, 25-Hydroxy: 10.5 ng/mL — ABNORMAL LOW (ref 30.0–100.0)

## 2022-05-21 NOTE — Progress Notes (Signed)
Patient presents today for a flu shot. YL,RMA 

## 2022-05-23 ENCOUNTER — Other Ambulatory Visit: Payer: Self-pay | Admitting: Nurse Practitioner

## 2022-05-23 DIAGNOSIS — E559 Vitamin D deficiency, unspecified: Secondary | ICD-10-CM

## 2022-05-23 MED ORDER — VITAMIN D (ERGOCALCIFEROL) 1.25 MG (50000 UNIT) PO CAPS: 50000.0000 [IU] | ORAL_CAPSULE | ORAL | 1 refills | Status: DC

## 2022-05-24 ENCOUNTER — Telehealth: Payer: Self-pay

## 2022-05-24 NOTE — Chronic Care Management (AMB) (Signed)
Chronic Care Management Pharmacy Assistant   Name: [name]  MRN: [id number] DOB: Jun 26, 1951  Reason for Encounter: Disease State/ Hypertension  Recent office visits:  05-21-2022 [name], CMA. Flu shot given.  05-20-2022 [name], [place]. Microalb/Creat Ratio= 39. Vitamin D= 10.5.  Recent consult visits:  None  Hospital visits:  None in previous 6 months  Medications: Outpatient Encounter Medications as of 05/24/2022  Medication Sig   amLODipine (NORVASC) 5 MG tablet Take 1 tablet (5 mg total) by mouth at bedtime.   atorvastatin (LIPITOR) 40 MG tablet TAKE 1 TABLET BY MOUTH EVERY DAY   EPINEPHrine 0.3 mg/0.3 mL IJ SOAJ injection Inject 0.3 mg into the muscle as needed for anaphylaxis.   valsartan-hydrochlorothiazide (DIOVAN-HCT) 160-12.5 MG tablet Take 1 tablet by mouth daily.   Vitamin D, Ergocalciferol, (DRISDOL) 1.25 MG (50000 UNIT) CAPS capsule Take 1 capsule (50,000 Units total) by mouth 2 (two) times a week.   No facility-administered encounter medications on file as of 05/24/2022.   Reviewed chart prior to disease state call. Spoke with patient regarding BP  Recent Office Vitals: BP Readings from Last 3 Encounters:  05/21/22 120/80  05/20/22 138/78  02/14/22 (!) 144/82   Pulse Readings from Last 3 Encounters:  05/21/22 79  05/20/22 93  02/14/22 71    Wt Readings from Last 3 Encounters:  05/21/22 212 lb (96.2 kg)  05/20/22 212 lb (96.2 kg)  02/14/22 220 lb (99.8 kg)     Kidney Function Lab Results  Component Value Date/Time   CREATININE 1.19 05/20/2022 12:45 PM   CREATININE 1.19 02/14/2022 12:10 PM   GFRNONAA 63 08/21/2020 02:53 PM   GFRAA 73 08/21/2020 02:53 PM       Latest Ref Rng & Units 05/20/2022   12:45 PM 02/14/2022   12:10 PM 11/05/2021    2:46 PM  BMP  Glucose 70 - 99 mg/dL 95  92  107   BUN 8 - 27 mg/dL '27  18  18   '$ Creatinine 0.76 - 1.27 mg/dL 1.19  1.19  1.16   BUN/Creat Ratio 10 - '24 23  15  16   '$ Sodium 134 -  144 mmol/L 138  138  142   Potassium 3.5 - 5.2 mmol/L 4.5  4.9  4.6   Chloride 96 - 106 mmol/L 103  98  102   CO2 20 - 29 mmol/L '24  26  26   '$ Calcium 8.6 - 10.2 mg/dL 9.6  9.5  9.4     Current antihypertensive regimen:  Amlodipine 5 mg daily Valsartan/HCTZ 160-12.5 mg daily  How often are you checking your Blood Pressure? daily  Current home BP readings: 09-11 122/65 74, 09-12 126/62 70, 09-13 128/65 70, 09-14 121/66 70, 09-15 123/63 70  What recent interventions/DTPs have been made by any provider to improve Blood Pressure control since last CPP Visit:  Educated on BP goals and benefits of medications for prevention of heart attack, stroke and kidney damage; Exercise goal of 150 minutes per week; Importance of home blood pressure monitoring; -Counseled to monitor BP at home at least five times per week , document, and provide log at future appointments -Collaborated with PCP to add Amlodipine 5 mg tablet once per day in the evening. Patient is comfortable with this approach and verbalized understanding.   Any recent hospitalizations or ED visits since last visit with CPP? No  What diet changes have been made to improve Blood Pressure Control?  Patient stated he limits  salt intake and drinks plenty of water  What exercise is being done to improve your Blood Pressure Control?  Patient stated he goes [facility] [employer] every morning to walk 2 miles. Patient also takes care of wife daily  Adherence Review: Is the patient currently on ACE/ARB medication? Yes Does the patient have >5 day gap between last estimated fill dates? No   Care Gaps: Covid booster overdue  Star Rating Drugs: Atorvastatin 40 mg- Last filled [facility]-03-2022 [facility] Valsartan/HCTZ 160-12.5 mg- Last filled [facility]-10-2022 [facility]  [place] Clinical Pharmacist Assistant [phone]

## 2022-05-31 ENCOUNTER — Other Ambulatory Visit: Payer: Self-pay | Admitting: Nurse Practitioner

## 2022-05-31 DIAGNOSIS — E559 Vitamin D deficiency, unspecified: Secondary | ICD-10-CM

## 2022-05-31 MED ORDER — AMLODIPINE BESYLATE 5 MG PO TABS: 5.0000 mg | ORAL_TABLET | Freq: Every day | ORAL | 1 refills | Status: DC

## 2022-05-31 MED ORDER — ATORVASTATIN CALCIUM 40 MG PO TABS: 40.0000 mg | ORAL_TABLET | Freq: Every day | ORAL | 1 refills | Status: DC

## 2022-05-31 MED ORDER — VITAMIN D (ERGOCALCIFEROL) 1.25 MG (50000 UNIT) PO CAPS: 50000.0000 [IU] | ORAL_CAPSULE | ORAL | 1 refills | Status: DC

## 2022-05-31 MED ORDER — VALSARTAN-HYDROCHLOROTHIAZIDE 160-12.5 MG PO TABS: 1.0000 | ORAL_TABLET | Freq: Every day | ORAL | 1 refills | Status: DC

## 2022-05-31 NOTE — Progress Notes (Addendum)
Chronic Care Management Pharmacy Assistant   Name: [name]  MRN: [id number] DOB: [birth year]   Reason for Encounter: Disease State/ Hypertension  Recent office visits:  None  Recent consult visits:  None  Hospital visits:  None in previous 6 months  Medications: Outpatient Encounter Medications as of 05/24/2022  Medication Sig   amLODipine (NORVASC) 5 MG tablet Take 1 tablet (5 mg total) by mouth at bedtime.   atorvastatin (LIPITOR) 40 MG tablet TAKE 1 TABLET BY MOUTH EVERY DAY   EPINEPHrine 0.3 mg/0.3 mL IJ SOAJ injection Inject 0.3 mg into the muscle as needed for anaphylaxis.   valsartan-hydrochlorothiazide (DIOVAN-HCT) 160-12.5 MG tablet Take 1 tablet by mouth daily.   Vitamin D, Ergocalciferol, (DRISDOL) 1.25 MG (50000 UNIT) CAPS capsule Take 1 capsule (50,000 Units total) by mouth 2 (two) times a week.   No facility-administered encounter medications on file as of 05/24/2022.   Reviewed chart prior to disease state call. Spoke with patient regarding BP  Recent Office Vitals: BP Readings from Last 3 Encounters:  05/21/22 120/80  05/20/22 138/78  02/14/22 (!) 144/82   Pulse Readings from Last 3 Encounters:  05/21/22 79  05/20/22 93  02/14/22 71    Wt Readings from Last 3 Encounters:  05/21/22 212 lb (96.2 kg)  05/20/22 212 lb (96.2 kg)  02/14/22 220 lb (99.8 kg)     Kidney Function Lab Results  Component Value Date/Time   CREATININE 1.19 05/20/2022 12:45 PM   CREATININE 1.19 02/14/2022 12:10 PM   GFRNONAA 63 08/21/2020 02:53 PM   GFRAA 73 08/21/2020 02:53 PM       Latest Ref Rng & Units 05/20/2022   12:45 PM 02/14/2022   12:10 PM 11/05/2021    2:46 PM  BMP  Glucose 70 - 99 mg/dL 95  92  107   BUN 8 - 27 mg/dL '27  18  18   '$ Creatinine 0.76 - 1.27 mg/dL 1.19  1.19  1.16   BUN/Creat Ratio 10 - '24 23  15  16   '$ Sodium 134 - 144 mmol/L 138  138  142   Potassium 3.5 - 5.2 mmol/L 4.5  4.9  4.6   Chloride 96 - 106 mmol/L 103  98  102   CO2 20 -  29 mmol/L '24  26  26   '$ Calcium 8.6 - 10.2 mg/dL 9.6  9.5  9.4     Current antihypertensive regimen:  Amlodipine 5 mg daily Valsartan/HCTZ 160-12.5 mg daily  How often are you checking your Blood Pressure? daily  Current home BP readings: 09-18 132/62 60, 09-19 132/60 60, 09-20 134/65 70, 09-21 127/65 73, 09-22 130/68 70.  What recent interventions/DTPs have been made by any provider to improve Blood Pressure control since last CPP Visit:  Educated on BP goals and benefits of medications for prevention of heart attack, stroke and kidney damage; Exercise goal of 150 minutes per week; Importance of home blood pressure monitoring; -Counseled to monitor BP at home at least five times per week , document, and provide log at future appointments -Collaborated with PCP to add Amlodipine 5 mg tablet once per day in the evening. Patient is comfortable with this approach and verbalized understanding.  Any recent hospitalizations or ED visits since last visit with CPP? No  What diet changes have been made to improve Blood Pressure Control?  Patient stated he limits salt intake and drinks plenty of water  What exercise is being done to improve your Blood Pressure  Control?  Patient stated he goes [facility] [employer] every morning to walk 2 miles. Patient also takes care of wife daily  Adherence Review: Is the patient currently on ACE/ARB medication? Yes Does the patient have >5 day gap between last estimated fill dates? No  05-31-2022: Completed patient's onboard: Atorvastatin 40 mg at bedtime- Last filled [facility]-20-2022 [facility] Next fill 08-16-2022 Amlodipine 5 mg before breakfast- Last filled 05-07-2022 [facility]. Next fill 06-06-2022 Valsartan/HCTZ 160-12.5 mg at bedtime- Last filled [facility]-10-2022 [facility]. Next fill 06-07-2022 Vitamin D 50,000 units 1 capsule at breakfast on Manday and Friday. Last filled 05-23-2022 90 DS. Next fill 08-21-2022  Onboard form completed, prescriptions requested from  PCP.   Care Gaps: Covid booster overdue  Star Rating Drugs: Atorvastatin 40 mg- Last filled 05-20-2022 [facility] Valsartan/HCTZ 160-12.5 mg- Last filled [facility]-10-2022 [facility]  [place] Clinical Pharmacist Assistant [phone]

## 2022-06-01 ENCOUNTER — Other Ambulatory Visit: Payer: Self-pay | Admitting: Nurse Practitioner

## 2022-06-03 ENCOUNTER — Other Ambulatory Visit: Payer: Self-pay

## 2022-06-03 DIAGNOSIS — E559 Vitamin D deficiency, unspecified: Secondary | ICD-10-CM

## 2022-06-03 DIAGNOSIS — Z91030 Bee allergy status: Secondary | ICD-10-CM

## 2022-06-03 MED ORDER — EPINEPHRINE 0.3 MG/0.3ML IJ SOAJ: 0.3000 mg | INTRAMUSCULAR | 2 refills | Status: DC | PRN

## 2022-06-03 MED ORDER — VITAMIN D (ERGOCALCIFEROL) 1.25 MG (50000 UNIT) PO CAPS: 50000.0000 [IU] | ORAL_CAPSULE | ORAL | 1 refills | Status: DC

## 2022-06-03 MED ORDER — AMLODIPINE BESYLATE 5 MG PO TABS: 5.0000 mg | ORAL_TABLET | Freq: Every day | ORAL | 1 refills | Status: DC

## 2022-06-03 MED ORDER — ATORVASTATIN CALCIUM 40 MG PO TABS: 40.0000 mg | ORAL_TABLET | Freq: Every day | ORAL | 1 refills | Status: DC

## 2022-06-03 MED ORDER — VALSARTAN-HYDROCHLOROTHIAZIDE 160-12.5 MG PO TABS: 1.0000 | ORAL_TABLET | Freq: Every day | ORAL | 1 refills | Status: DC

## 2022-06-04 ENCOUNTER — Other Ambulatory Visit: Payer: Self-pay | Admitting: Nurse Practitioner

## 2022-06-26 ENCOUNTER — Telehealth: Payer: Self-pay

## 2022-06-26 NOTE — Chronic Care Management (AMB) (Signed)
    Called [facility] Pulling, No answer, left message of appointment on 06-28-2022 at via telephone visit with [name], Pharm D. Notified to have all medications, supplements, blood pressure and/or blood sugar logs available during appointment and to return call if need to reschedule.    Care Gaps: Covid booster overdue  Star Rating Drug: Atorvastatin 40 mg- Last filled 05-20-2022 [facility] Valsartan/HCTZ 160-12.5 mg- Last filled [facility]-10-[facility]22 [facility]  Any gaps in medications fill history? No  [place] Pharmacist Assistant [phone]

## 2022-06-28 ENCOUNTER — Ambulatory Visit (INDEPENDENT_AMBULATORY_CARE_PROVIDER_SITE_OTHER): Payer: Medicare Other

## 2022-06-28 DIAGNOSIS — E7800 Pure hypercholesterolemia, unspecified: Secondary | ICD-10-CM

## 2022-06-28 DIAGNOSIS — E559 Vitamin D deficiency, unspecified: Secondary | ICD-10-CM

## 2022-06-28 DIAGNOSIS — E785 Hyperlipidemia, unspecified: Secondary | ICD-10-CM

## 2022-06-28 DIAGNOSIS — I10 Essential (primary) hypertension: Secondary | ICD-10-CM

## 2022-06-28 NOTE — Progress Notes (Signed)
Chronic Care Management Pharmacy Note  07/02/2022 Name:  [name] MRN:  [id number] DOB:  Jun 12, 1951  Summary: Patient reports that he has been doing well. He is taking most of his medications daily but does miss taking his Vitamin D twice per week.   Recommendations/Changes made from today's visit: Recommend patients lipid panel be completed in office  Recommend patient have microalbumin test completed in three months Recommend Vitamin D deficiency is added to patients chart   Plan: Patient reports that he will come in to complete lab work  Collaborate with PCP team to determine next steps based on patients blood work, cannot be on November 9th, 2023.   Subjective: [name] is an 71 y.o. year old male who is a primary patient of [name], [place].  The CCM team was consulted for assistance with disease management and care coordination needs.    Engaged with patient by telephone for follow up visit in response to provider referral for pharmacy case management and/or care coordination services.   Consent to Services:  The patient was given information about Chronic Care Management services, agreed to services, and gave verbal consent prior to initiation of services.  Please see initial visit note for detailed documentation.   Patient Care Team: [name], FNP as PCP - General (General Practice) [name], [facility] (Pharmacist)  Recent office visits: 05/20/2022 PCP OV 02/14/2022 PCP V  Hospital visits: None in previous 6 months   Objective:  Lab Results  Component Value Date   CREATININE 1.19 05/20/2022   BUN 27 05/20/2022   EGFR 65 05/20/2022   GFRNONAA 63 08/21/2020   GFRAA 73 08/21/2020   NA 138 05/20/2022   K 4.5 05/20/2022   CALCIUM 9.6 05/20/2022   CO2 24 05/20/2022   GLUCOSE 95 05/20/2022    Lab Results  Component Value Date/Time   HGBA1C 5.1 08/21/2020 02:53 PM   HGBA1C 4.8 08/04/2019 10:13 AM   MICROALBUR 150 08/21/2020 12:57  PM    Last diabetic Eye exam: No results found for: "HMDIABEYEEXA"  Last diabetic Foot exam: No results found for: "HMDIABFOOTEX"   Lab Results  Component Value Date   CHOL 185 02/14/2022   HDL 43 02/14/2022   LDLCALC 100 (H) 02/14/2022   TRIG 244 (H) 02/14/2022   CHOLHDL 4.3 02/14/2022       Latest Ref Rng & Units 02/14/2022   12:10 PM 11/14/2020   12:48 PM 08/21/2020    2:53 PM  Hepatic Function  Total Protein 6.0 - 8.5 g/dL 7.5  7.2  7.0   Albumin 3.7 - 4.7 g/dL 4.5  4.3  4.0   AST 0 - 40 IU/L '20  21  22   ' ALT 0 - 44 IU/L '24  27  29   ' Alk Phosphatase 44 - 121 IU/L 76  78  78   Total Bilirubin 0.0 - 1.2 mg/dL 0.7  0.5  0.6     Lab Results  Component Value Date/Time   TSH 1.880 05/20/2022 12:45 PM   TSH 1.660 08/04/2019 10:13 AM       Latest Ref Rng & Units 08/21/2020    2:53 PM 04/24/2009   11:13 AM 01/22/2008    9:05 AM  CBC  WBC 3.4 - 10.8 x10E3/uL 5.1   4.7   Hemoglobin 13.0 - 17.7 g/dL 15.8  17.0  11.9   Hematocrit 37.5 - 51.0 % 46.9   35.0   Platelets 150 - 450 x10E3/uL 148  179     Lab Results  Component Value Date/Time   VD25OH 10.5 (L) 05/20/2022 12:45 PM    Clinical ASCVD: No  The 10-year ASCVD risk score (Arnett DK, et al., 2019) is: 17.9%   Values used to calculate the score:     Age: 71 years     Sex: Male     Is Non-Hispanic African American: Yes     Diabetic: No     Tobacco smoker: No     Systolic Blood Pressure: 643 mmHg     Is BP treated: Yes     HDL Cholesterol: 43 mg/dL     Total Cholesterol: 185 mg/dL       05/20/2022   11:51 AM 01/18/2022    9:34 AM 11/05/2021    2:10 PM  Depression screen PHQ 2/9  Decreased Interest 0 0 0  Down, Depressed, Hopeless 0 0 0  PHQ - 2 Score 0 0 0  Altered sleeping   0  Tired, decreased energy   0  Change in appetite   0  Feeling bad or failure about yourself    0  Trouble concentrating   0  Moving slowly or fidgety/restless   0  Suicidal thoughts   0  PHQ-9 Score   0      Social History    Tobacco Use  Smoking Status Never  Smokeless Tobacco Never   BP Readings from Last 3 Encounters:  05/21/22 120/80  05/20/22 138/78  02/14/22 (!) 144/82   Pulse Readings from Last 3 Encounters:  05/21/22 79  05/20/22 93  02/14/22 71   Wt Readings from Last 3 Encounters:  05/21/22 212 lb (96.2 kg)  05/20/22 212 lb (96.2 kg)  02/14/22 220 lb (99.8 kg)   BMI Readings from Last 3 Encounters:  05/21/22 33.20 kg/m  05/20/22 33.20 kg/m  02/14/22 34.46 kg/m    Assessment/Interventions: Review of patient past medical history, allergies, medications, health status, including review of consultants reports, laboratory and other test data, was performed as part of comprehensive evaluation and provision of chronic care management services.   SDOH:  (Social Determinants of Health) assessments and interventions performed: No  SDOH Screenings   Food Insecurity: No Food Insecurity (01/18/2022)  Transportation Needs: No Transportation Needs (01/18/2022)  Depression (PHQ2-9): Low Risk  (05/20/2022)  Financial Resource Strain: Low Risk  (01/18/2022)  Physical Activity: Insufficiently Active (01/18/2022)  Social Connections: Unknown (08/04/2019)  Stress: Stress Concern Present (01/18/2022)  Tobacco Use: Low Risk  (05/20/2022)    [place]  No Known Allergies  Medications Reviewed Today     Reviewed by [name], [place] (Pharmacist) on 06/28/22 at 1016  Med List Status: <None>   Medication Order Taking? Sig Documenting Provider Last Dose Status Informant  amLODipine (NORVASC) 5 MG tablet [id number]  Take 1 tablet (5 mg total) by mouth at bedtime. [name], FNP  Active   atorvastatin (LIPITOR) 40 MG tablet [id number]  Take 1 tablet (40 mg total) by mouth daily. [name], FNP  Active   EPINEPHrine 0.3 mg/0.3 mL IJ SOAJ injection [id number]  Inject 0.3 mg into the muscle as needed for anaphylaxis. [name], FNP  Active   valsartan-hydrochlorothiazide (DIOVAN-HCT)  160-12.5 MG tablet [id number]  TAKE 1 TABLET BY MOUTH EVERY DAY [name], FNP  Active   Vitamin D, Ergocalciferol, (DRISDOL) 1.25 MG (50000 UNIT) CAPS capsule [id number]  Take 1 capsule (50,000 Units total) by mouth 2 (two) times a week. [name], FNP  Active             Patient Active Problem List   Diagnosis Date Noted   Essential hypertension 08/21/2020   Elevated cholesterol 08/21/2020    Immunization History  Administered Date(s) Administered   Fluad Quad(high Dose 65+) 08/16/2020, 05/28/2021, 05/21/2022   Influenza, High Dose Seasonal PF 07/07/2019   PFIZER Comirnaty(Gray Top)Covid-19 Tri-Sucrose Vaccine 03/16/2021   PFIZER(Purple Top)SARS-COV-2 Vaccination 01/14/2020, 02/08/2020, 09/15/2020   PNEUMOCOCCAL CONJUGATE-20 05/31/2021   Tdap 10/12/2019   Zoster Recombinat (Shingrix) 03/16/2021, 06/01/2021    Conditions to be addressed/monitored:  Hypertension and Hyperlipidemia  Care Plan : [place]  Updates made by [name], RPH since 07/02/2022 12:00 AM     Problem: HTN, HLD, Vitamin D Deficiency      Long-Range Goal: Disease Management   Recent Progress: On track  Priority: High  Note:   Current Barriers:  Unable to independently monitor therapeutic efficacy Does not maintain contact with provider office  Pharmacist Clinical Goal(s):  Patient will achieve adherence to monitoring guidelines and medication adherence to achieve therapeutic efficacy through collaboration with PharmD and provider.   Interventions: 1:1 collaboration with [name], FNP regarding development and update of comprehensive plan of care as evidenced by provider attestation and co-signature Inter-disciplinary care team collaboration (see longitudinal plan of care) Comprehensive medication review performed; medication list updated in electronic medical record  Hypertension (BP goal <140/90) -Controlled -Current treatment: Amlodipine 5 mg tablet once per  day Appropriate, Effective, Safe, Accessible Valsartan-hydrochlorothiazide 160-12.5 mg tablet once per day Appropriate, Effective, Safe, Accessible -Current home readings: 140/61, 118/68, 127/65,130/62, 130/60 -Current dietary habits: he is eating fried fish, and steak. Yesterday he had cabbage and coleslaw at least three days per week -Current exercise habits: he is walking every morning, he is walking for one hour from 5:15 - 6:15  -Denies hypotensive/hypertensive symptoms -Educated on Exercise goal of 150 minutes per week; -Counseled to monitor BP at home at least once per day , document, and provide log at future appointments -Recommended to continue current medication  Hyperlipidemia: (LDL goal < 100) -Controlled -Current treatment: Atorvastatin 40 mg tablet once per day Appropriate, Effective, Safe, Accessible -Current dietary habits: he is eating fried fish, and steak. Yesterday he had cabbage and coleslaw at least three days per week  -We discussed in detail the importance of maintaining good eating habits  -Current exercise habits: he is walking for at least an hour every day.  -Educated on Cholesterol goals;  Benefits of statin for ASCVD risk reduction; Importance of limiting foods high in cholesterol;  -Recommended to continue current medication -Collaborate with PCP to have lipid panel and chemistry completed for patient since 8 weeks since changing dose, if no change in labs, will recommend patient be started on Ezetimibe 10 mg tablet daily.   Vitamin D Deficiency (Goal: 30-100 ng/mL) -Not ideally controlled -Current treatment  Vitamin D (Egocalciferol 1.25 MG) 50000 unit- Take 1 capsule by mouth two times per week Appropriate, Effective, Safe, Accessible -Mr. Nester stated he has missed a couple of doses of Vitamin D, he was taking it on Monday and Wednesday.  -Collaborated with Mr. Kadrmas and he is going to start to taking his Vitamin D on Monday and Fridays.  -Recommended  to continue current medication   Patient Goals/Self-Care Activities Patient will:  - take medications as prescribed as evidenced by patient report and record review  Follow Up Plan: The patient has been provided with contact information for the care  management team and has been advised to call with any health related questions or concerns.       Medication Assistance: None required.  Patient affirms current coverage meets needs.  Compliance/Adherence/Medication fill history: Care Gaps: COVID-19 Booster   Star-Rating Drugs: Atorvastatin 40 mg tablet daily  Valsartan-Hydrochlorothiazide 160-12.5 mg tablet   Patient's preferred pharmacy is:  [id number]Phone: [phone]: [phone] [facility] [place]Phone: [phone]: [phone] [facility] [place] [place]Phone: [phone]: [phone] Uses pill box? No - patient reports the he does not see the need Pt endorses 90% compliance  We discussed: Benefits of medication synchronization, packaging and delivery as well as enhanced pharmacist oversight with [facility]. Patient decided to: Continue current medication management strategy  Care Plan and Follow Up Patient Decision:  Patient agrees to Care Plan and Follow-up.  Plan: The patient has been provided with contact information for the care management team and has been advised to call with any health related questions or concerns.   VOrlando Penner CPP, PharmD Clinical Pharmacist Practitioner [facility] [phone]

## 2022-07-02 NOTE — Patient Instructions (Addendum)
Visit Information It was great speaking with you today!  Please let me know if you have any questions about our visit.   Goals Addressed             This Visit's Progress    Manage My Medicine       Timeframe:  Long-Range Goal Priority:  High Start Date:                                             Follow Up Date 07/19/2022   In Progress - keep a list of all the medicines I take; vitamins and herbals too - use a pillbox to sort medicine - use an alarm clock or phone to remind me to take my medicine    Why is this important?   These steps will help you keep on track with your medicines.        Track and Manage My Blood Pressure-Hypertension       Timeframe:  Long-Range Goal Priority:  High Start Date:                             Expected End Date:                       Follow Up Date 07/19/2022   In Progress:   - check blood pressure 5 times per week - write blood pressure results in a log or diary  -Please START taking Amlodipine 5 mg tablet at bedtime and CONTINUE TO TAKE: Valsartan 160 mg tablet and HCTZ 12.5 MG tablet in the morning   Why is this important?   You won't feel high blood pressure, but it can still hurt your blood vessels.  High blood pressure can cause heart or kidney problems. It can also cause a stroke.  Making lifestyle changes like losing a little weight or eating less salt will help.  Checking your blood pressure at home and at different times of the day can help to control blood pressure.  If the doctor prescribes medicine remember to take it the way the doctor ordered.  Call the office if you cannot afford the medicine or if there are questions about it.     Notes:  -Please let us know if you have any questions         Patient Care Plan: CCM Pharmacy Care Plan     Problem Identified: HTN, HLD, Vitamin D Deficiency      Long-Range Goal: Disease Management   Recent Progress: On track  Priority: High  Note:   Current Barriers:   Unable to independently monitor therapeutic efficacy Does not maintain contact with provider office  Pharmacist Clinical Goal(s):  Patient will achieve adherence to monitoring guidelines and medication adherence to achieve therapeutic efficacy through collaboration with PharmD and provider.   Interventions: 1:1 collaboration with [name], FNP regarding development and update of comprehensive plan of care as evidenced by provider attestation and co-signature Inter-disciplinary care team collaboration (see longitudinal plan of care) Comprehensive medication review performed; medication list updated in electronic medical record  Hypertension (BP goal <140/90) -Controlled -Current treatment: Amlodipine 5 mg tablet once per day Appropriate, Effective, Safe, Accessible Valsartan-hydrochlorothiazide 160-12.5 mg tablet once per day Appropriate, Effective, Safe, Accessible -Current home readings: 140/61, 118/68, 127/65,130/62, 130/60 -Current dietary habits: he  is eating fried fish, and steak. Yesterday he had cabbage and coleslaw at least three days per week -Current exercise habits: he is walking every morning, he is walking for one hour from 5:15 - 6:15  -Denies hypotensive/hypertensive symptoms -Educated on Exercise goal of 150 minutes per week; -Counseled to monitor BP at home at least once per day , document, and provide log at future appointments -Recommended to continue current medication  Hyperlipidemia: (LDL goal < 100) -Controlled -Current treatment: Atorvastatin 40 mg tablet once per day Appropriate, Effective, Safe, Accessible -Current dietary habits: he is eating fried fish, and steak. Yesterday he had cabbage and coleslaw at least three days per week  -We discussed in detail the importance of maintaining good eating habits  -Current exercise habits: he is walking for at least an hour every day.  -Educated on Cholesterol goals;  Benefits of statin for ASCVD risk  reduction; Importance of limiting foods high in cholesterol;  -Recommended to continue current medication -Collaborate with PCP to have lipid panel and chemistry completed for patient since 8 weeks since changing dose, if no change in labs, will recommend patient be started on Ezetimibe 10 mg tablet daily.   Vitamin D Deficiency (Goal: 30-100 ng/mL) -Not ideally controlled -Current treatment  Vitamin D (Egocalciferol 1.25 MG) 50000 unit- Take 1 capsule by mouth two times per week Appropriate, Effective, Safe, Accessible -Mr. Ghee stated he has missed a couple of doses of Vitamin D, he was taking it on Monday and Wednesday.  -Collaborated with Mr. Hyland and he is going to start to taking his Vitamin D on Monday and Fridays.  -Recommended to continue current medication   Patient Goals/Self-Care Activities Patient will:  - take medications as prescribed as evidenced by patient report and record review  Follow Up Plan: The patient has been provided with contact information for the care management team and has been advised to call with any health related questions or concerns.       Patient agreed to services and verbal consent obtained.   The patient verbalized understanding of instructions, educational materials, and care plan provided today and agreed to receive a mailed copy of patient instructions, educational materials, and care plan.   [name], PharmD Clinical Pharmacist [facility] [phone]

## 2022-07-09 DIAGNOSIS — I10 Essential (primary) hypertension: Secondary | ICD-10-CM | POA: Diagnosis not present

## 2022-07-09 DIAGNOSIS — E785 Hyperlipidemia, unspecified: Secondary | ICD-10-CM | POA: Diagnosis not present

## 2022-07-17 ENCOUNTER — Telehealth: Payer: Self-pay

## 2022-07-17 NOTE — Progress Notes (Signed)
    Called [name], No answer, left message of appointment on 07-19-2022 at 9:00 via telephone visit with [name], Pharm D. Notified to have all medications, supplements, blood pressure and/or blood sugar logs available during appointment and to return call if need to reschedule.  Care Gaps: Covid booster overdue   Star Rating Drug: Atorvastatin 40 mg- Last filled [facility]-20-2022 [facility] Valsartan/HCTZ 160-12.5 mg- Last filled [facility]-10-2022 [facility]  Any gaps in medications fill history? No  [place] Pharmacist Assistant [phone]

## 2022-07-19 ENCOUNTER — Ambulatory Visit (INDEPENDENT_AMBULATORY_CARE_PROVIDER_SITE_OTHER): Payer: Medicare Other

## 2022-07-19 DIAGNOSIS — I10 Essential (primary) hypertension: Secondary | ICD-10-CM

## 2022-07-19 DIAGNOSIS — E782 Mixed hyperlipidemia: Secondary | ICD-10-CM

## 2022-07-19 NOTE — Progress Notes (Signed)
Chronic Care Management Pharmacy Note  07/23/2022 Name:  [name] MRN:  [id number] DOB:  September 18, 1950  Summary: [name] reports that he is doing well. He is taking his medications daily and has received his COVID booster.   Recommendations/Changes made from today's visit: Recommend patient have lipid panel and CMP completed, since dose change in September  Recommend [name] drink at least 4 bottles of water per day Patient has not had an A1c completed since 08/2020 Recommend patient have prostate exam completed, he reports that he can not remember when this was last done   Plan: Patient to have labs drawn to include CMP and lipid panel in office on Tuesday November 14 at 9:20 am.  Mr. [name] is going to start drinking 4 bottles of water per day  Recommend an A1c be completed since last completed in 08/2020.  Collaborate with PCP on patients wishes to have a prostate exam   Subjective: [name] is an 71 y.o. year old male who is a primary patient of [name], [place].  The CCM team was consulted for assistance with disease management and care coordination needs.    Engaged with patient by telephone for follow up visit in response to provider referral for pharmacy case management and/or care coordination services.   Consent to Services:  The patient was given information about Chronic Care Management services, agreed to services, and gave verbal consent prior to initiation of services.  Please see initial visit note for detailed documentation.   Patient Care Team: Jose Brine, FNP as PCP - General (General Practice) [name], [facility] (Pharmacist)  Recent office visits: 05/20/2022 PCP [name] visits: None in previous 6 months   Objective:  Lab Results  Component Value Date   CREATININE 1.19 05/20/2022   BUN 27 05/20/2022   EGFR 65 05/20/2022   GFRNONAA 63 08/21/2020   GFRAA 73 08/21/2020   NA 138 05/20/2022   K 4.5 05/20/2022    CALCIUM 9.6 05/20/2022   CO2 24 05/20/2022   GLUCOSE 95 05/20/2022    Lab Results  Component Value Date/Time   HGBA1C 5.1 08/21/2020 02:53 PM   HGBA1C 4.8 08/04/2019 10:13 AM   MICROALBUR 150 08/21/2020 12:57 PM    Last diabetic Eye exam: No results found for: "HMDIABEYEEXA"  Last diabetic Foot exam: No results found for: "HMDIABFOOTEX"   Lab Results  Component Value Date   CHOL 185 02/14/2022   HDL 43 02/14/2022   LDLCALC 100 (H) 02/14/2022   TRIG 244 (H) 02/14/2022   CHOLHDL 4.3 02/14/2022       Latest Ref Rng & Units 02/14/2022   12:10 PM 11/14/2020   12:48 PM 08/21/2020    2:53 PM  Hepatic Function  Total Protein 6.0 - 8.5 g/dL 7.5  7.2  7.0   Albumin 3.7 - 4.7 g/dL 4.5  4.3  4.0   AST 0 - 40 IU/L _0 ALT 0 - 44 IU/L _1 Alk Phosphatase 44 - 121 IU/L 76  78  78   Total Bilirubin 0.0 - 1.2 mg/dL 0.7  0.5  0.6     Lab Results  Component Value Date/Time   TSH 1.880 05/20/2022 12:45 PM   TSH 1.660 08/04/2019 10:13 AM       Latest Ref Rng & Units 08/21/2020    2:53 PM 04/24/2009   11:13 AM 01/22/2008    9:05 AM  CBC  WBC 3.4 - 10.8 x10E3/uL 5.1   4.7   Hemoglobin 13.0 - 17.7 g/dL 15.8  17.0  11.9   Hematocrit 37.5 - 51.0 % 46.9   35.0   Platelets 150 - 450 x10E3/uL 148   179     Lab Results  Component Value Date/Time   VD25OH 10.5 (L) 05/20/2022 12:45 PM    Clinical ASCVD: No  The 10-year ASCVD risk score (Arnett DK, et al., 2019) is: 18.5%   Values used to calculate the score:     Age: 71 years     Sex: Male     Is Non-Hispanic African American: Yes     Diabetic: No     Tobacco smoker: No     Systolic Blood Pressure: 502 mmHg     Is BP treated: Yes     HDL Cholesterol: 43 mg/dL     Total Cholesterol: 185 mg/dL       05/20/2022   11:51 AM 01/18/2022    9:34 AM 11/05/2021    2:10 PM  Depression screen PHQ 2/9  Decreased Interest 0 0 0  Down, Depressed, Hopeless 0 0 0  PHQ - 2 Score 0 0 0  Altered sleeping   0  Tired, decreased  energy   0  Change in appetite   0  Feeling bad or failure about yourself    0  Trouble concentrating   0  Moving slowly or fidgety/restless   0  Suicidal thoughts   0  PHQ-9 Score   0     Social History   Tobacco Use  Smoking Status Never  Smokeless Tobacco Never   BP Readings from Last 3 Encounters:  05/21/22 120/80  05/20/22 138/78  02/14/22 (!) 144/82   Pulse Readings from Last 3 Encounters:  05/21/22 79  05/20/22 93  02/14/22 71   Wt Readings from Last 3 Encounters:  05/21/22 212 lb (96.2 kg)  05/20/22 212 lb (96.2 kg)  02/14/22 220 lb (99.8 kg)   BMI Readings from Last 3 Encounters:  05/21/22 33.20 kg/m  05/20/22 33.20 kg/m  02/14/22 34.46 kg/m    Assessment/Interventions: Review of patient past medical history, allergies, medications, health status, including review of consultants reports, laboratory and other test data, was performed as part of comprehensive evaluation and provision of chronic care management services.   SDOH:  (Social Determinants of Health) assessments and interventions performed: No  SDOH Screenings   Food Insecurity: No Food Insecurity (01/18/2022)  Transportation Needs: No Transportation Needs (01/18/2022)  Depression (PHQ2-9): Low Risk  (05/20/2022)  Financial Resource Strain: Low Risk  (01/18/2022)  Physical Activity: Insufficiently Active (01/18/2022)  Social Connections: Unknown (08/04/2019)  Stress: Stress Concern Present (01/18/2022)  Tobacco Use: Low Risk  (05/20/2022)    [place]  No Known Allergies  Medications Reviewed Today     Reviewed by [name], [place] (Pharmacist) on 06/28/22 at 1016  Med List Status: <None>   Medication Order Taking? Sig Documenting Provider Last Dose Status Informant  amLODipine (NORVASC) 5 MG tablet [id number]  Take 1 tablet (5 mg total) by mouth at bedtime. Jose Brine, FNP  Active   atorvastatin (LIPITOR) 40 MG tablet [id number]  Take 1 tablet (40 mg total) by mouth daily. Jose Brine, FNP  Active   EPINEPHrine 0.3 mg/0.3 mL IJ SOAJ injection [id number]  Inject 0.3 mg into the muscle as needed for anaphylaxis. Jose Brine, FNP  Active   valsartan-hydrochlorothiazide (DIOVAN-HCT) 160-12.5 MG tablet [id number]  TAKE 1 TABLET BY MOUTH EVERY DAY Jose Brine, FNP  Active   Vitamin D, Ergocalciferol, (DRISDOL) 1.25 MG (50000 UNIT) CAPS capsule [id number]  Take 1 capsule (50,000 Units total) by mouth 2 (two) times a week. Jose Brine, FNP  Active             Patient Active Problem List   Diagnosis Date Noted   Essential hypertension 08/21/2020   Elevated cholesterol 08/21/2020    Immunization History  Administered Date(s) Administered   Fluad Quad(high Dose 65+) 08/16/2020, 05/28/2021, 05/21/2022   Influenza, High Dose Seasonal PF 07/07/2019   PFIZER Comirnaty(Gray Top)Covid-19 Tri-Sucrose Vaccine 03/16/2021   PFIZER(Purple Top)SARS-COV-2 Vaccination 01/14/2020, 02/08/2020, 09/15/2020   PNEUMOCOCCAL CONJUGATE-20 05/31/2021   Tdap 10/12/2019   Zoster Recombinat (Shingrix) 03/16/2021, 06/01/2021    Conditions to be addressed/monitored:  Hypertension and Hyperlipidemia  Care Plan : [place]  Updates made by [name], [place] since 07/23/2022 12:00 AM     Problem: HTN, HLD      Long-Range Goal: Disease Management   Recent Progress: On track  Priority: High  Note:   Current Barriers:  Unable to independently monitor therapeutic efficacy  Pharmacist Clinical Goal(s):  Patient will achieve adherence to monitoring guidelines and medication adherence to achieve therapeutic efficacy through collaboration with PharmD and provider.   Interventions: 1:1 collaboration with Jose Brine, FNP regarding development and update of comprehensive plan of care as evidenced by provider attestation and co-signature Inter-disciplinary care team collaboration (see longitudinal plan of care) Comprehensive medication review performed; medication list  updated in electronic medical record  Hypertension (BP goal <130/80) -Controlled -Current treatment: Amlodipine 5 mg tablet once per day Appropriate, Effective, Safe, Accessible Valsartan -hydrochlorothiazide 160-12.5 taking 1 tablet by mouth daily Appropriate, Effective, Safe, Accessible -Current home readings: 129/68  -Current dietary habits: he had broiled fish, cabbage with onions on top and cabbage  -Current exercise habits: he is walking every morning for at least an hour. Currently he is walking 2.25 miles per day. Next week Monday he is going to start walking 2.5 miles per day.  -Denies hypotensive/hypertensive symptoms -Educated on Symptoms of hypotension and importance of maintaining adequate hydration; -Counseled to monitor BP at home at least 5 days per week, document, and provide log at future appointments -Recommended to continue current medication  Hyperlipidemia: (LDL goal < 70) -Uncontrolled -Current treatment: Atorvastatin 40 mg tablet once per day Appropriate, Effective, Safe, Accessible -Current dietary patterns: he is limiting fried and fatty foods  -Educated on Cholesterol goals;  Benefits of statin for ASCVD risk reduction; -Congratulated Mr. Imbert on not eating out as much.  -Recommended to continue current medication -Patient to have labs drawn in office orders placed for next week to include lipid panel and cmp will determine next steps from there.    Patient Goals/Self-Care Activities Patient will:  - take medications as prescribed as evidenced by patient report and record review check blood pressure at least 5 days per week, document, and provide at future appointments  Follow Up Plan: The patient has been provided with contact information for the care management team and has been advised to call with any health related questions or concerns.       Medication Assistance: None required.  Patient affirms current coverage meets  needs.  Compliance/Adherence/Medication fill history: Care Gaps: COVID-19 Booster - completed and documented in [facility] 07/01/2022 - lot #[id number] - moderna    Star-Rating Drugs: Atorvastatin 40 mg tablet  Valsartan-hydrochlorothiazide 160-12.5 mg tablet once  per day   [facility]  [id number]Phone: [phone]: [phone] [place]Phone: [phone]: [phone] [facility] [place] [address] [place]Phone: [phone]: [phone] Uses pill box? No - patient keeps medication in vial Pt endorses 95% compliance  We discussed: Benefits of medication synchronization, packaging and delivery as well as enhanced pharmacist oversight with [facility]. Patient decided to: Continue current medication management strategy  Care Plan and Follow Up Patient Decision:  Patient agrees to Care Plan and Follow-up.  Plan: Telephone follow up appointment with care management team member scheduled for:  11/12/2022 Next PCP appointment scheduled for:  09/23/2022  Next AWV (Annual Wellness Visit) scheduled for:  01/29/2023   VOrlando Penner CPP, PharmD Clinical Pharmacist Practitioner [facility] [phone]

## 2022-07-19 NOTE — Patient Instructions (Signed)
Visit Information It was great speaking with you today!  Please let me know if you have any questions about our visit.   Goals Addressed             This Visit's Progress    Manage My Medicine       Timeframe:  Long-Range Goal Priority:  High Start Date:                                           Follow Up Date 11/12/2022   In Progress - keep a list of all the medicines I take; vitamins and herbals too - use a pillbox to sort medicine - use an alarm clock or phone to remind me to take my medicine    Why is this important?   These steps will help you keep on track with your medicines.        Track and Manage My Blood Pressure-Hypertension       Timeframe:  Long-Range Goal Priority:  High Start Date:                             Expected End Date:                       Follow Up Date:11/12/2022  In Progress:   - check blood pressure 5 times per week - write blood pressure results in a log or diary  -Please START taking Amlodipine 5 mg tablet at bedtime and CONTINUE TO TAKE: Valsartan 160 mg tablet and HCTZ 12.5 MG tablet in the morning   Why is this important?   You won't feel high blood pressure, but it can still hurt your blood vessels.  High blood pressure can cause heart or kidney problems. It can also cause a stroke.  Making lifestyle changes like losing a little weight or eating less salt will help.  Checking your blood pressure at home and at different times of the day can help to control blood pressure.  If the doctor prescribes medicine remember to take it the way the doctor ordered.  Call the office if you cannot afford the medicine or if there are questions about it.     Notes:  -Please let us know if you have any questions         Patient Care Plan: CCM Pharmacy Care Plan     Problem Identified: HTN, HLD      Long-Range Goal: Disease Management   Recent Progress: On track  Priority: High  Note:   Current Barriers:  Unable to independently  monitor therapeutic efficacy  Pharmacist Clinical Goal(s):  Patient will achieve adherence to monitoring guidelines and medication adherence to achieve therapeutic efficacy through collaboration with PharmD and provider.   Interventions: 1:1 collaboration with [name], FNP regarding development and update of comprehensive plan of care as evidenced by provider attestation and co-signature Inter-disciplinary care team collaboration (see longitudinal plan of care) Comprehensive medication review performed; medication list updated in electronic medical record  Hypertension (BP goal <130/80) -Controlled -Current treatment: Amlodipine 5 mg tablet once per day Appropriate, Effective, Safe, Accessible Valsartan -hydrochlorothiazide 160-12.5 taking 1 tablet by mouth daily Appropriate, Effective, Safe, Accessible -Current home readings: 129/68  -Current dietary habits: he had broiled fish, cabbage with onions on top and cabbage  -Current exercise habits:  he is walking every morning for at least an hour. Currently he is walking 2.25 miles per day. Next week Monday he is going to start walking 2.5 miles per day.  -Denies hypotensive/hypertensive symptoms -Educated on Symptoms of hypotension and importance of maintaining adequate hydration; -Counseled to monitor BP at home at least 5 days per week, document, and provide log at future appointments -Recommended to continue current medication  Hyperlipidemia: (LDL goal < 70) -Uncontrolled -Current treatment: Atorvastatin 40 mg tablet once per day Appropriate, Effective, Safe, Accessible -Current dietary patterns: he is limiting fried and fatty foods  -Educated on Cholesterol goals;  Benefits of statin for ASCVD risk reduction; -Congratulated Mr. Chittick on not eating out as much.  -Recommended to continue current medication -Patient to have labs drawn in office orders placed for next week to include lipid panel and cmp.    Patient  Goals/Self-Care Activities Patient will:  - take medications as prescribed as evidenced by patient report and record review check blood pressure at least 5 days per week, document, and provide at future appointments  Follow Up Plan: The patient has been provided with contact information for the care management team and has been advised to call with any health related questions or concerns.      Patient agreed to services and verbal consent obtained.   The patient verbalized understanding of instructions, educational materials, and care plan provided today and agreed to receive a mailed copy of patient instructions, educational materials, and care plan.   [name], PharmD Clinical Pharmacist [facility] [phone]

## 2022-07-22 ENCOUNTER — Telehealth: Payer: Self-pay

## 2022-07-22 NOTE — Progress Notes (Signed)
07-22-2022: Reminded patient of lab appointment on 07-23-2022 at 9:20. [facility]. Patient will update office tomorrow about covid booster.  [place] Pharmacist Assistant [phone]

## 2022-07-23 ENCOUNTER — Other Ambulatory Visit: Payer: Medicare Other

## 2022-07-23 ENCOUNTER — Other Ambulatory Visit: Payer: Self-pay | Admitting: Nurse Practitioner

## 2022-07-23 DIAGNOSIS — E782 Mixed hyperlipidemia: Secondary | ICD-10-CM

## 2022-07-24 LAB — CMP14+EGFR
ALT: 31 IU/L (ref 0–44)
AST: 25 IU/L (ref 0–40)
Albumin/Globulin Ratio: 1.9 (ref 1.2–2.2)
Albumin: 4.7 g/dL (ref 3.8–4.8)
Alkaline Phosphatase: 74 IU/L (ref 44–121)
BUN/Creatinine Ratio: 16 (ref 10–24)
BUN: 20 mg/dL (ref 8–27)
Bilirubin Total: 0.6 mg/dL (ref 0.0–1.2)
CO2: 23 mmol/L (ref 20–29)
Calcium: 9.5 mg/dL (ref 8.6–10.2)
Chloride: 101 mmol/L (ref 96–106)
Creatinine, Ser: 1.22 mg/dL (ref 0.76–1.27)
Globulin, Total: 2.5 g/dL (ref 1.5–4.5)
Glucose: 92 mg/dL (ref 70–99)
Potassium: 4.7 mmol/L (ref 3.5–5.2)
Sodium: 141 mmol/L (ref 134–144)
Total Protein: 7.2 g/dL (ref 6.0–8.5)
eGFR: 63 mL/min/{1.73_m2} (ref >59)

## 2022-07-24 LAB — LIPID PANEL
Chol/HDL Ratio: 3.2 ratio (ref 0.0–5.0)
Cholesterol, Total: 152 mg/dL (ref 100–199)
HDL: 48 mg/dL (ref >39)
LDL Chol Calc (NIH): 83 mg/dL (ref 0–99)
Triglycerides: 116 mg/dL (ref 0–149)
VLDL Cholesterol Cal: 21 mg/dL (ref 5–40)

## 2022-07-31 ENCOUNTER — Telehealth: Payer: Self-pay

## 2022-07-31 NOTE — Chronic Care Management (AMB) (Signed)
    Chronic Care Management Pharmacy Assistant   Name: [name]  MRN: [id number] DOB: April 25, 1951  Reason for Encounter: Medication Review/ Medication Coordination  Recent office visits:  None  Recent consult visits:  None  Hospital visits:  None in previous 6 months  Medications: Outpatient Encounter Medications as of 07/31/2022  Medication Sig   amLODipine (NORVASC) 5 MG tablet Take 1 tablet (5 mg total) by mouth at bedtime.   atorvastatin (LIPITOR) 40 MG tablet Take 1 tablet (40 mg total) by mouth daily.   EPINEPHrine 0.3 mg/0.3 mL IJ SOAJ injection Inject 0.3 mg into the muscle as needed for anaphylaxis.   valsartan-hydrochlorothiazide (DIOVAN-HCT) 160-12.5 MG tablet TAKE 1 TABLET BY MOUTH EVERY DAY   Vitamin D, Ergocalciferol, (DRISDOL) 1.25 MG (50000 UNIT) CAPS capsule Take 1 capsule (50,000 Units total) by mouth 2 (two) times a week.   No facility-administered encounter medications on file as of 07/31/2022.  Reviewed chart for medication changes ahead of medication coordination call.  No OVs, Consults, or hospital visits since last care coordination call/Pharmacist visit.  No medication changes indicated   BP Readings from Last 3 Encounters:  05/21/22 120/80  05/20/22 138/78  02/14/22 (!) 144/82    Lab Results  Component Value Date   HGBA1C 5.1 08/21/2020     Patient obtains medications through Adherence Packaging  30 Days   Last adherence delivery included:  None- 1st delivery  Patient declined (meds) last month: None- 1st delivery  Patient is due for next adherence delivery on: 08-14-2022  Called patient and reviewed medications and coordinated delivery.  This delivery to include: Atorvastatin 40 mg at bedtime Amlodipine 5 mg before breakfast Valsartan/HCTZ 160-12.5 mg at bedtime Vitamin D 50,000 units before breakfast  No short/ acute fill needed  Patient declined the following medications: None  Patient needs refills  for: None  Confirmed delivery date of 08-14-2002, advised patient that pharmacy will contact them the morning of delivery.  Care Gaps: None  Star Rating Drugs: Atorvastatin 40 mg- Last filled [facility]20-[facility]22 [facility] Valsartan/HCTZ 160-12.5 mg- Last filled [facility]  [place] Clinical Pharmacist Assistant [phone]

## 2022-08-08 DIAGNOSIS — I10 Essential (primary) hypertension: Secondary | ICD-10-CM

## 2022-08-08 DIAGNOSIS — E782 Mixed hyperlipidemia: Secondary | ICD-10-CM

## 2022-08-13 ENCOUNTER — Telehealth: Payer: Self-pay

## 2022-08-13 NOTE — Chronic Care Management (AMB) (Signed)
08-13-2022: Left patient a VM to go over medications and medication time for [facility] delivery.   08-14-2022: Patient was informed valsartan/HCTZ won't be filled until 09-30-2022 since [facility]. Per [name] patient will take Amlodipine at Bedtime, Valsartan/HCTZ at breakfast and Vitamin D before breakfast on Monday and Friday. Patient was informed.  [place] Pharmacist Assistant [phone]

## 2022-08-15 ENCOUNTER — Other Ambulatory Visit: Payer: Self-pay | Admitting: Nurse Practitioner

## 2022-08-29 ENCOUNTER — Telehealth: Payer: Self-pay

## 2022-08-29 NOTE — Chronic Care Management (AMB) (Signed)
    Chronic Care Management Pharmacy Assistant   Name: TAMARA KENYON  MRN: [id number] DOB: 11/12/1950  Reason for Encounter: Medication Review/ Medication coordination  Recent office visits:  None  Recent consult visits:  None  Hospital visits:  None in previous 6 months  Medications: Outpatient Encounter Medications as of 08/29/2022  Medication Sig   amLODipine (NORVASC) 5 MG tablet Take 1 tablet (5 mg total) by mouth at bedtime.   atorvastatin (LIPITOR) 40 MG tablet TAKE 1 TABLET BY MOUTH EVERY DAY   EPINEPHrine 0.3 mg/0.3 mL IJ SOAJ injection Inject 0.3 mg into the muscle as needed for anaphylaxis.   valsartan-hydrochlorothiazide (DIOVAN-HCT) 160-12.5 MG tablet TAKE 1 TABLET BY MOUTH EVERY DAY   Vitamin D, Ergocalciferol, (DRISDOL) 1.25 MG (50000 UNIT) CAPS capsule Take 1 capsule (50,000 Units total) by mouth 2 (two) times a week.   No facility-administered encounter medications on file as of 08/29/2022.  Reviewed chart for medication changes ahead of medication coordination call.  No OVs, Consults, or hospital visits since last care coordination call/Pharmacist visit.   No medication changes indicated   BP Readings from Last 3 Encounters:  05/21/22 120/80  05/20/22 138/78  02/14/22 (!) 144/82    Lab Results  Component Value Date   HGBA1C 5.1 08/21/2020     Patient obtains medications through Adherence Packaging  30 Days   Last adherence delivery included: Atorvastatin 40 mg at bedtime Amlodipine 5 mg at bedtime Vitamin D 50,000 units before breakfast on Monday and friday  Patient declined (meds) last month  [facility]  Patient is due for next adherence delivery on: 09-12-2021  Called patient and reviewed medications and coordinated delivery.  This delivery to include: Atorvastatin 40 mg at bedtime Amlodipine 5 mg at bedtime Vitamin D 50,000 units before breakfast on Monday and Friday  No acute/short fill  needed  Patient declined the following medications: Valsartan/HCTZ- due 09-30-2021  Patient needs refills for: None  Confirmed delivery date of 09-12-2021 advised patient that pharmacy will contact them the morning of delivery.  Care Gaps: Covid booster overdue  Star Rating Drugs: Atorvastatin 40 mg- Last filled 08-09-2022 [facility]. [facility] Valsartan/HCTZ 160-12.5 mg- Last filled [facility]  [place] Clinical Pharmacist Assistant [phone]

## 2022-09-10 ENCOUNTER — Telehealth: Payer: Self-pay

## 2022-09-10 NOTE — Chronic Care Management (AMB) (Cosign Needed)
09-10-2022: Was instructed to confirm if [facility] since [facility]. Patient stated he [facility].  [place] Pharmacist Assistant [phone]

## 2022-09-11 NOTE — Progress Notes (Deleted)
09-10-2022: Was instructed to confirm if [facility] wants to [facility] since [facility]. [facility] stated he [facility].   [name] CMA

## 2022-09-23 ENCOUNTER — Ambulatory Visit (INDEPENDENT_AMBULATORY_CARE_PROVIDER_SITE_OTHER): Payer: Medicare Other | Admitting: Nurse Practitioner

## 2022-09-23 ENCOUNTER — Other Ambulatory Visit: Payer: Self-pay | Admitting: Nurse Practitioner

## 2022-09-23 ENCOUNTER — Encounter: Payer: Self-pay | Admitting: Nurse Practitioner

## 2022-09-23 VITALS — BP 142/82 | HR 75 | Temp 98.4°F | Ht 67.0 in | Wt 211.0 lb

## 2022-09-23 DIAGNOSIS — I10 Essential (primary) hypertension: Secondary | ICD-10-CM | POA: Diagnosis not present

## 2022-09-23 DIAGNOSIS — R051 Acute cough: Secondary | ICD-10-CM

## 2022-09-23 DIAGNOSIS — E782 Mixed hyperlipidemia: Secondary | ICD-10-CM

## 2022-09-23 MED ORDER — AMOXICILLIN 875 MG PO TABS: 875.0000 mg | ORAL_TABLET | Freq: Two times a day (BID) | ORAL | 0 refills | Status: DC

## 2022-09-23 MED ORDER — PREDNISONE 10 MG (21) PO TBPK: ORAL_TABLET | ORAL | 0 refills | Status: DC

## 2022-09-23 MED ORDER — BENZONATATE 100 MG PO CAPS: 100.0000 mg | ORAL_CAPSULE | Freq: Four times a day (QID) | ORAL | 1 refills | Status: DC | PRN

## 2022-09-23 NOTE — Patient Instructions (Signed)
Hypertension, Adult High blood pressure (hypertension) is when the force of blood pumping through the arteries is too strong. The arteries are the blood vessels that carry blood from the heart throughout the body. Hypertension forces the heart to work harder to pump blood and may cause arteries to become narrow or stiff. Untreated or uncontrolled hypertension can lead to a heart attack, heart failure, a stroke, kidney disease, and other problems. A blood pressure reading consists of a higher number over a lower number. Ideally, your blood pressure should be below 120/80. The first ("top") number is called the systolic pressure. It is a measure of the pressure in your arteries as your heart beats. The second ("bottom") number is called the diastolic pressure. It is a measure of the pressure in your arteries as the heart relaxes. What are the causes? The exact cause of this condition is not known. There are some conditions that result in high blood pressure. What increases the risk? Certain factors may make you more likely to develop high blood pressure. Some of these risk factors are under your control, including: Smoking. Not getting enough exercise or physical activity. Being overweight. Having too much fat, sugar, calories, or salt (sodium) in your diet. Drinking too much alcohol. Other risk factors include: Having a personal history of heart disease, diabetes, high cholesterol, or kidney disease. Stress. Having a family history of high blood pressure and high cholesterol. Having obstructive sleep apnea. Age. The risk increases with age. What are the signs or symptoms? High blood pressure may not cause symptoms. Very high blood pressure (hypertensive crisis) may cause: Headache. Fast or irregular heartbeats (palpitations). Shortness of breath. Nosebleed. Nausea and vomiting. Vision changes. Severe chest pain, dizziness, and seizures. How is this diagnosed? This condition is diagnosed by  measuring your blood pressure while you are seated, with your arm resting on a flat surface, your legs uncrossed, and your feet flat on the floor. The cuff of the blood pressure monitor will be placed directly against the skin of your upper arm at the level of your heart. Blood pressure should be measured at least twice using the same arm. Certain conditions can cause a difference in blood pressure between your right and left arms. If you have a high blood pressure reading during one visit or you have normal blood pressure with other risk factors, you may be asked to: Return on a different day to have your blood pressure checked again. Monitor your blood pressure at home for 1 week or longer. If you are diagnosed with hypertension, you may have other blood or imaging tests to help your health care provider understand your overall risk for other conditions. How is this treated? This condition is treated by making healthy lifestyle changes, such as eating healthy foods, exercising more, and reducing your alcohol intake. You may be referred for counseling on a healthy diet and physical activity. Your health care provider may prescribe medicine if lifestyle changes are not enough to get your blood pressure under control and if: Your systolic blood pressure is above 130. Your diastolic blood pressure is above 80. Your personal target blood pressure may vary depending on your medical conditions, your age, and other factors. Follow these instructions at home: Eating and drinking  Eat a diet that is high in fiber and potassium, and low in sodium, added sugar, and fat. An example of this eating plan is called the DASH diet. DASH stands for Dietary Approaches to Stop Hypertension. To eat this way: Eat   plenty of fresh fruits and vegetables. Try to fill one half of your plate at each meal with fruits and vegetables. Eat whole grains, such as whole-wheat pasta, brown rice, or whole-grain bread. Fill about one  fourth of your plate with whole grains. Eat or drink low-fat dairy products, such as skim milk or low-fat yogurt. Avoid fatty cuts of meat, processed or cured meats, and poultry with skin. Fill about one fourth of your plate with lean proteins, such as fish, chicken without skin, beans, eggs, or tofu. Avoid pre-made and processed foods. These tend to be higher in sodium, added sugar, and fat. Reduce your daily sodium intake. Many people with hypertension should eat less than 1,500 mg of sodium a day. Do not drink alcohol if: Your health care provider tells you not to drink. You are pregnant, may be pregnant, or are planning to become pregnant. If you drink alcohol: Limit how much you have to: 0-1 drink a day for women. 0-2 drinks a day for men. Know how much alcohol is in your drink. In the [place] one drink equals one 12 oz bottle of beer (355 mL), one 5 oz glass of wine (148 mL), or one 1 oz glass of hard liquor (44 mL). Lifestyle  Work with your health care provider to maintain a healthy body weight or to lose weight. Ask what an ideal weight is for you. Get at least 30 minutes of exercise that causes your heart to beat faster (aerobic exercise) most days of the week. Activities may include walking, swimming, or biking. Include exercise to strengthen your muscles (resistance exercise), such as Pilates or lifting weights, as part of your weekly exercise routine. Try to do these types of exercises for 30 minutes at least 3 days a week. Do not use any products that contain nicotine or tobacco. These products include cigarettes, chewing tobacco, and vaping devices, such as e-cigarettes. If you need help quitting, ask your health care provider. Monitor your blood pressure at home as told by your health care provider. Keep all follow-up visits. This is important. Medicines Take over-the-counter and prescription medicines only as told by your health care provider. Follow directions carefully. Blood  pressure medicines must be taken as prescribed. Do not skip doses of blood pressure medicine. Doing this puts you at risk for problems and can make the medicine less effective. Ask your health care provider about side effects or reactions to medicines that you should watch for. Contact a health care provider if you: Think you are having a reaction to a medicine you are taking. Have headaches that keep coming back (recurring). Feel dizzy. Have swelling in your ankles. Have trouble with your vision. Get help right away if you: Develop a severe headache or confusion. Have unusual weakness or numbness. Feel faint. Have severe pain in your chest or abdomen. Vomit repeatedly. Have trouble breathing. These symptoms may be an emergency. Get help right away. Call 911. Do not wait to see if the symptoms will go away. Do not drive yourself to the hospital. Summary Hypertension is when the force of blood pumping through your arteries is too strong. If this condition is not controlled, it may put you at risk for serious complications. Your personal target blood pressure may vary depending on your medical conditions, your age, and other factors. For most people, a normal blood pressure is less than 120/80. Hypertension is treated with lifestyle changes, medicines, or a combination of both. Lifestyle changes include losing weight, eating a healthy,   low-sodium diet, exercising more, and limiting alcohol. This information is not intended to replace advice given to you by your health care provider. Make sure you discuss any questions you have with your health care provider. Document Revised: 07/03/2021 Document Reviewed: 07/03/2021 Elsevier Patient Education  2023 Elsevier Inc.  

## 2022-09-23 NOTE — Progress Notes (Signed)
I,Tianna Badgett,acting as a Education administrator for [employer], FNP.,have documented all relevant documentation on the behalf of [name], FNP,as directed by  [name], FNP while in the presence of [name], [place]. Subjective:     Patient ID: [name] , male    DOB: 1951/05/08 , 72 y.o.   MRN: [id number]   Chief Complaint  Patient presents with   Hypertension    HPI  Patient presents today for a blood pressure f/u. He has been taking fishermans friends cough drops. He feels like due to exercising in the morning and leaving sweating and not wearing a hat on his head. He has a cough that keeps him up at night due to the nasal congestion. He is not taken any nasal sprays previous history of over using afrin.   Hypertension This is a chronic problem. The current episode started more than 1 year ago. The problem has been gradually improving since onset. The problem is controlled. Pertinent negatives include no anxiety, chest pain, headaches, palpitations or shortness of breath. There are no associated agents to hypertension. Risk factors for coronary artery disease include sedentary lifestyle and obesity. Past treatments include diuretics and angiotensin blockers. There are no compliance problems.  There is no history of angina. There is no history of chronic renal disease.     Past Medical History:  Diagnosis Date   Hypertension      Family History  Problem Relation Age of Onset   Dementia Father    Colon cancer Neg Hx    Colon polyps Neg Hx    Esophageal cancer Neg Hx    Rectal cancer Neg Hx    Stomach cancer Neg Hx      Current Outpatient Medications:    amoxicillin (AMOXIL) 875 MG tablet, Take 1 tablet (875 mg total) by mouth 2 (two) times daily., Disp: 14 tablet, Rfl: 0   benzonatate (TESSALON PERLES) 100 MG capsule, Take 1 capsule (100 mg total) by mouth every 6 (six) hours as needed., Disp: 30 capsule, Rfl: 1   predniSONE (STERAPRED UNI-PAK 21 TAB) 10 MG (21) TBPK  tablet, Take as directed, Disp: 21 tablet, Rfl: 0   amLODipine (NORVASC) 5 MG tablet, Take 1 tablet (5 mg total) by mouth at bedtime., Disp: 90 tablet, Rfl: 1   atorvastatin (LIPITOR) 40 MG tablet, TAKE 1 TABLET BY MOUTH EVERY DAY, Disp: 90 tablet, Rfl: 1   EPINEPHrine 0.3 mg/0.3 mL IJ SOAJ injection, Inject 0.3 mg into the muscle as needed for anaphylaxis., Disp: 1 each, Rfl: 2   valsartan-hydrochlorothiazide (DIOVAN-HCT) 160-12.5 MG tablet, TAKE 1 TABLET BY MOUTH EVERY DAY, Disp: 90 tablet, Rfl: 1   Vitamin D, Ergocalciferol, (DRISDOL) 1.25 MG (50000 UNIT) CAPS capsule, Take 1 capsule (50,000 Units total) by mouth 2 (two) times a week., Disp: 24 capsule, Rfl: 1   No Known Allergies   Review of Systems  Constitutional: Negative.   Respiratory:  Positive for cough (unable to cough up any secretions). Negative for shortness of breath.   Cardiovascular: Negative.  Negative for chest pain and palpitations.  Gastrointestinal: Negative.   Neurological: Negative.  Negative for headaches.  Psychiatric/Behavioral: Negative.       Today's Vitals   09/23/22 1124 09/23/22 1145  BP: (!) 150/88 (!) 142/82  Pulse: 75   Temp: 98.4 F (36.9 C)   TempSrc: Oral   Weight: 211 lb (95.7 kg)   Height: '5\' 7"'$  (1.702 m)   PainSc: 0-No pain    Body mass index  is 33.05 kg/m.   Objective:  Physical Exam Vitals reviewed.  Constitutional:      General: He is not in acute distress.    Appearance: Normal appearance. He is obese.  Cardiovascular:     Rate and Rhythm: Normal rate and regular rhythm.     Pulses: Normal pulses.     Heart sounds: Normal heart sounds. No murmur heard. Pulmonary:     Effort: Pulmonary effort is normal. No respiratory distress.     Breath sounds: Examination of the right-lower field reveals decreased breath sounds. Examination of the left-lower field reveals decreased breath sounds. Decreased breath sounds present. No wheezing.  Skin:    General: Skin is warm and dry.      Capillary Refill: Capillary refill takes less than 2 seconds.     Coloration: Skin is not jaundiced.     Findings: No bruising.  Neurological:     General: No focal deficit present.     Mental Status: He is alert and oriented to person, place, and time.     Cranial Nerves: No cranial nerve deficit.     Motor: No weakness.  Psychiatric:        Mood and Affect: Mood normal.        Behavior: Behavior normal.        Thought Content: Thought content normal.        Judgment: Judgment normal.         Assessment And Plan:     1. Essential hypertension Comments: Blood pressure is elevated, repeat is improved advised to avoid taking medications that are not HBP [place]  2. Mixed hyperlipidemia Comments: cholesterol levels are improved, continue current medications  3. Acute cough Comments: 2 week history, will treat with amoxicillin and prednisone. Has diminished breath sounds bilaterally. - benzonatate (TESSALON PERLES) 100 MG capsule; Take 1 capsule (100 mg total) by mouth every 6 (six) hours as needed.  Dispense: 30 capsule; Refill: 1 - predniSONE (STERAPRED UNI-PAK 21 TAB) 10 MG (21) TBPK tablet; Take as directed  Dispense: 21 tablet; Refill: 0 - amoxicillin (AMOXIL) 875 MG tablet; Take 1 tablet (875 mg total) by mouth 2 (two) times daily.  Dispense: 14 tablet; Refill: 0     Patient was given opportunity to ask questions. Patient verbalized understanding of the plan and was able to repeat key elements of the plan. All questions were answered to their satisfaction.  [name], FNP   I, [name], FNP, have reviewed all documentation for this visit. The documentation on 09/23/22 for the exam, diagnosis, procedures, and orders are all accurate and complete.   IF YOU HAVE BEEN REFERRED TO A SPECIALIST, IT MAY TAKE 1-2 WEEKS TO SCHEDULE/PROCESS THE REFERRAL. IF YOU HAVE NOT HEARD FROM US/SPECIALIST IN TWO WEEKS, PLEASE GIVE [place] A CALL AT [phone].   THE PATIENT IS  ENCOURAGED TO PRACTICE SOCIAL DISTANCING DUE TO THE COVID-19 PANDEMIC.

## 2022-10-30 ENCOUNTER — Other Ambulatory Visit: Payer: Self-pay | Admitting: Nurse Practitioner

## 2022-12-29 ENCOUNTER — Other Ambulatory Visit: Payer: Self-pay | Admitting: Nurse Practitioner

## 2022-12-29 DIAGNOSIS — E559 Vitamin D deficiency, unspecified: Secondary | ICD-10-CM

## 2023-01-22 ENCOUNTER — Ambulatory Visit: Payer: Medicare Other | Admitting: Nurse Practitioner

## 2023-01-29 ENCOUNTER — Ambulatory Visit (INDEPENDENT_AMBULATORY_CARE_PROVIDER_SITE_OTHER): Payer: Medicare Other

## 2023-01-29 VITALS — Ht 67.5 in | Wt 210.0 lb

## 2023-01-29 DIAGNOSIS — Z0000 Encounter for general adult medical examination without abnormal findings: Secondary | ICD-10-CM

## 2023-01-29 NOTE — Progress Notes (Signed)
I connected with  Adonis Housekeeper on 01/29/23 by a audio enabled telemedicine application and verified that I am speaking with the correct person using two identifiers.  Patient Location: Home  Provider Location: Office/Clinic  I discussed the limitations of evaluation and management by telemedicine. The patient expressed understanding and agreed to proceed.  Subjective:   [name] is a 72 y.o. male who presents for Medicare Annual/Subsequent preventive examination.  Review of Systems     Cardiac Risk Factors include: advanced age (>36men, >39 women);hypertension;male gender;obesity (BMI >30kg/m2)     Objective:    Today's Vitals   01/29/23 1435  Weight: 210 lb (95.3 kg)  Height: 5' 7.5" (1.715 m)   Body mass index is 32.41 kg/m.     01/29/2023    2:38 PM 01/18/2022    9:33 AM  Advanced Directives  Does Patient Have a Medical Advance Directive? No No    Current Medications (verified) Outpatient Encounter Medications as of 01/29/2023  Medication Sig   amLODipine (NORVASC) 5 MG tablet TAKE ONE TABLET BY MOUTH EVERYDAY AT BEDTIME   atorvastatin (LIPITOR) 40 MG tablet TAKE ONE TABLET BY MOUTH EVERYDAY AT BEDTIME   EPINEPHrine 0.3 mg/0.3 mL IJ SOAJ injection Inject 0.3 mg into the muscle as needed for anaphylaxis.   valsartan-hydrochlorothiazide (DIOVAN-HCT) 160-12.5 MG tablet TAKE ONE TABLET BY MOUTH EVERY MORNING   Vitamin D, Ergocalciferol, (DRISDOL) 1.25 MG (50000 UNIT) CAPS capsule TAKE ONE CAPSULE BY MOUTH ONCE WEEKLY BEFORE BREAKFAST ON MONDAY and TAKE ONE CAPSULE BY MOUTH ONCE WEEKLY BEFORE BREAKFAST ON FRIDAYS   amoxicillin (AMOXIL) 875 MG tablet Take 1 tablet (875 mg total) by mouth 2 (two) times daily. (Patient not taking: Reported on 01/29/2023)   benzonatate (TESSALON PERLES) 100 MG capsule Take 1 capsule (100 mg total) by mouth every 6 (six) hours as needed. (Patient not taking: Reported on 01/29/2023)   predniSONE (STERAPRED UNI-PAK 21 TAB) 10 MG (21)  TBPK tablet Take as directed (Patient not taking: Reported on 01/29/2023)   No facility-administered encounter medications on file as of 01/29/2023.    Allergies (verified) Patient has no known allergies.   History: Past Medical History:  Diagnosis Date   Hypertension    Past Surgical History:  Procedure Laterality Date   COLONOSCOPY  2005   COLONOSCOPY  10/06/2019   Family History  Problem Relation Age of Onset   Dementia Father    Colon cancer Neg Hx    Colon polyps Neg Hx    Esophageal cancer Neg Hx    Rectal cancer Neg Hx    Stomach cancer Neg Hx    Social History   Socioeconomic History   Marital status: Married    Spouse name: Not on file   Number of children: Not on file   Years of education: Not on file   Highest education level: Not on file  Occupational History   Not on file  Tobacco Use   Smoking status: Never   Smokeless tobacco: Never  Vaping Use   Vaping Use: Never used  Substance and Sexual Activity   Alcohol use: Not Currently   Drug use: Never   Sexual activity: Not on file  Other Topics Concern   Not on file  Social History Narrative   Not on file   Social Determinants of Health   Financial Resource Strain: Low Risk  (01/29/2023)   Overall Financial Resource Strain (CARDIA)    Difficulty of Paying Living Expenses: Not hard at all  Food  Insecurity: No Food Insecurity (01/29/2023)   Hunger Vital Sign    Worried About Running Out of Food in the Last Year: Never true    Ran Out of Food in the Last Year: Never true  Transportation Needs: No Transportation Needs (01/29/2023)   PRAPARE - Administrator, Civil Service (Medical): No    Lack of Transportation (Non-Medical): No  Physical Activity: Sufficiently Active (01/29/2023)   Exercise Vital Sign    Days of Exercise per Week: 5 days    Minutes of Exercise per Session: 90 min  Stress: No Stress Concern Present (01/29/2023)   [employer] of Occupational Health - Occupational  Stress Questionnaire    Feeling of Stress : Not at all  Social Connections: Unknown (08/04/2019)   Social Connection and Isolation Panel [NHANES]    Frequency of Communication with Friends and Family: Patient declined    Frequency of Social Gatherings with Friends and Family: Patient declined    Attends Religious Services: Patient declined    Database administrator or Organizations: Patient declined    Attends Engineer, structural: Patient declined    Marital Status: Patient declined    Tobacco Counseling Counseling given: Not Answered   Clinical Intake:  Pre-visit preparation completed: Yes  Pain : No/denies pain     Nutritional Status: BMI > 30  Obese Nutritional Risks: None Diabetes: No  How often do you need to have someone help you when you read instructions, pamphlets, or other written materials from your doctor or pharmacy?: 1 - Never  Diabetic? no  Interpreter Needed?: No  Information entered by :: NAllen LPN   Activities of Daily Living    01/29/2023    2:39 PM  In your present state of health, do you have any difficulty performing the following activities:  Hearing? 0  Vision? 0  Difficulty concentrating or making decisions? 0  Walking or climbing stairs? 0  Dressing or bathing? 0  Doing errands, shopping? 0  Preparing Food and eating ? N  Using the Toilet? N  In the past six months, have you accidently leaked urine? N  Do you have problems with loss of bowel control? N  Managing your Medications? N  Managing your Finances? N  Housekeeping or managing your Housekeeping? N    Patient Care Team: [name], FNP as PCP - General (General Practice) Harlan Stains, [facility] (Pharmacist)  Indicate any recent Medical Services you may have received from other than [facility] providers in the past year (date may be approximate).     Assessment:   This is a routine wellness examination for [place].  Hearing/Vision screen Vision Screening -  Comments:: Regular eye exams, [facility]  Dietary issues and exercise activities discussed: Current Exercise Habits: Home exercise routine, Type of exercise: walking, Time (Minutes): > 60, Frequency (Times/Week): 5, Weekly Exercise (Minutes/Week): 0   Goals Addressed             This Visit's Progress    Patient Stated       01/29/2023, no goals       Depression Screen    01/29/2023    2:39 PM 09/23/2022   11:17 AM 05/20/2022   11:51 AM 01/18/2022    9:34 AM 11/05/2021    2:10 PM 08/21/2020    9:49 AM 08/04/2019    9:13 AM  PHQ 2/9 Scores  PHQ - 2 Score 0 0 0 0 0 0 0  PHQ- 9 Score  0      Fall Risk    01/29/2023    2:39 PM 09/23/2022   11:16 AM 05/20/2022   11:51 AM 01/18/2022    9:34 AM 11/05/2021    2:10 PM  Fall Risk   Falls in the past year? 0 0 0 0 0  Number falls in past yr: 0 0 0 0 0  Injury with Fall? 0 0 0 0 0  Risk for fall due to : Medication side effect No Fall Risks No Fall Risks No Fall Risks   Follow up Falls prevention discussed;Education provided;Falls evaluation completed Falls evaluation completed Falls evaluation completed Falls evaluation completed;Education provided;Falls prevention discussed     FALL RISK PREVENTION PERTAINING TO THE HOME:  Any stairs in or around the home? Yes  If so, are there any without handrails? Yes  Home free of loose throw rugs in walkways, pet beds, electrical cords, etc? Yes  Adequate lighting in your home to reduce risk of falls? Yes   ASSISTIVE DEVICES UTILIZED TO PREVENT FALLS:  Life alert? No  Use of a cane, walker or w/c? No  Grab bars in the bathroom? No  Shower chair or bench in shower? No  Elevated toilet seat or a handicapped toilet? No   TIMED UP AND GO:  Was the test performed? No .       Cognitive Function:        01/29/2023    2:39 PM 01/18/2022    9:35 AM  6CIT Screen  What Year? 0 points 0 points  What month? 0 points 0 points  What time? 0 points 0 points  Count back from 20 0  points 0 points  Months in reverse 0 points 0 points  Repeat phrase 2 points 0 points  Total Score 2 points 0 points    Immunizations Immunization History  Administered Date(s) Administered   Fluad Quad(high Dose 65+) 08/16/2020, 05/28/2021, 05/21/2022   Influenza, High Dose Seasonal PF 07/07/2019   PFIZER Comirnaty(Gray Top)Covid-19 Tri-Sucrose Vaccine 03/16/2021   PFIZER(Purple Top)SARS-COV-2 Vaccination 01/14/2020, 02/08/2020, 09/15/2020   PNEUMOCOCCAL CONJUGATE-20 05/31/2021   Pfizer Covid-19 Vaccine Bivalent Booster 65yrs & up 07/01/2022   Tdap 10/12/2019   Zoster Recombinat (Shingrix) 03/16/2021, 06/01/2021    TDAP status: Up to date  Flu Vaccine status: Up to date  Pneumococcal vaccine status: Up to date  Covid-19 vaccine status: Completed vaccines  Qualifies for Shingles Vaccine? Yes   Zostavax completed Yes   Shingrix Completed?: Yes  Screening Tests Health Maintenance  Topic Date Due   COVID-19 Vaccine (6 - 2023-24 season) 08/26/2022   Medicare Annual Wellness (AWV)  01/19/2023   INFLUENZA VACCINE  04/10/2023   COLONOSCOPY (Pts 45-68yrs Insurance coverage will need to be confirmed)  10/05/2029   DTaP/Tdap/Td (2 - Td or Tdap) 10/11/2029   Pneumonia Vaccine 56+ Years old  Completed   Hepatitis C Screening  Completed   Zoster Vaccines- Shingrix  Completed   HPV VACCINES  Aged Out    Health Maintenance  Health Maintenance Due  Topic Date Due   COVID-19 Vaccine (6 - 2023-24 season) 08/26/2022   Medicare Annual Wellness (AWV)  01/19/2023    Colorectal cancer screening: No longer required.   Lung Cancer Screening: (Low Dose CT Chest recommended if Age 28-80 years, 30 pack-year currently smoking OR have quit w/in 15years.) does not qualify.   Lung Cancer Screening Referral: no  Additional Screening:  Hepatitis C Screening: does qualify; Completed 08/04/2019  Vision Screening: Recommended annual ophthalmology  exams for early detection of glaucoma and  other disorders of the eye. Is the patient up to date with their annual eye exam?  Yes  Who is the provider or what is the name of the office in which the patient attends annual eye exams? [facility] If pt is not established with a provider, would they like to be referred to a provider to establish care? No .   Dental Screening: Recommended annual dental exams for proper oral hygiene  Community Resource Referral / Chronic Care Management: CRR required this visit?  No   CCM required this visit?  No      Plan:     I have personally reviewed and noted the following in the patient's chart:   Medical and social history Use of alcohol, tobacco or illicit drugs  Current medications and supplements including opioid prescriptions. Patient is not currently taking opioid prescriptions. Functional ability and status Nutritional status Physical activity Advanced directives List of other physicians Hospitalizations, surgeries, and ER visits in previous 12 months Vitals Screenings to include cognitive, depression, and falls Referrals and appointments  In addition, I have reviewed and discussed with patient certain preventive protocols, quality metrics, and best practice recommendations. A written personalized care plan for preventive services as well as general preventive health recommendations were provided to patient.     Barb Merino, LPN   [id number]   Nurse Notes: none  Due to this being a virtual visit, the after visit summary with patients personalized plan was offered to patient via mail or my-chart. to pick up at office at next visit

## 2023-01-29 NOTE — Patient Instructions (Signed)
[name] , Thank you for taking time to come for your Medicare Wellness Visit. I appreciate your ongoing commitment to your health goals. Please review the following plan we discussed and let me know if I can assist you in the future.   These are the goals we discussed:  Goals      Manage My Medicine     Timeframe:  Long-Range Goal Priority:  High Start Date:                                           Follow Up Date 11/12/2022   In Progress - keep a list of all the medicines I take; vitamins and herbals too - use a pillbox to sort medicine - use an alarm clock or phone to remind me to take my medicine    Why is this important?   These steps will help you keep on track with your medicines.        Patient Stated     01/18/2022, no goals     Patient Stated     01/29/2023, no goals     Track and Manage My Blood Pressure-Hypertension     Timeframe:  Long-Range Goal Priority:  High Start Date:                             Expected End Date:                       Follow Up Date:11/12/2022  In Progress:   - check blood pressure 5 times per week - write blood pressure results in a log or diary  -Please START taking Amlodipine 5 mg tablet at bedtime and CONTINUE TO TAKE: Valsartan 160 mg tablet and HCTZ 12.5 MG tablet in the morning   Why is this important?   You won't feel high blood pressure, but it can still hurt your blood vessels.  High blood pressure can cause heart or kidney problems. It can also cause a stroke.  Making lifestyle changes like losing a little weight or eating less salt will help.  Checking your blood pressure at home and at different times of the day can help to control blood pressure.  If the doctor prescribes medicine remember to take it the way the doctor ordered.  Call the office if you cannot afford the medicine or if there are questions about it.     Notes:  -Please let us know if you have any questions         This is a list of the screening  recommended for you and due dates:  Health Maintenance  Topic Date Due   COVID-19 Vaccine (6 - 2023-24 season) 08/26/2022   Flu Shot  04/10/2023   Medicare Annual Wellness Visit  01/29/2024   Colon Cancer Screening  10/05/2029   DTaP/Tdap/Td vaccine (2 - Td or Tdap) 10/11/2029   Pneumonia Vaccine  Completed   Hepatitis C Screening: USPSTF Recommendation to screen - Ages 7-79 yo.  Completed   Zoster (Shingles) Vaccine  Completed   HPV Vaccine  Aged Out    Advanced directives: Advance directive discussed with you today.   Conditions/risks identified: none  Next appointment: Follow up in one year for your annual wellness visit.   Preventive Care 50 Years and Older, Male  Preventive  care refers to lifestyle choices and visits with your health care provider that can promote health and wellness. What does preventive care include? A yearly physical exam. This is also called an annual well check. Dental exams once or twice a year. Routine eye exams. Ask your health care provider how often you should have your eyes checked. Personal lifestyle choices, including: Daily care of your teeth and gums. Regular physical activity. Eating a healthy diet. Avoiding tobacco and drug use. Limiting alcohol use. Practicing safe sex. Taking low doses of aspirin every day. Taking vitamin and mineral supplements as recommended by your health care provider. What happens during an annual well check? The services and screenings done by your health care provider during your annual well check will depend on your age, overall health, lifestyle risk factors, and family history of disease. Counseling  Your health care provider may ask you questions about your: Alcohol use. Tobacco use. Drug use. Emotional well-being. Home and relationship well-being. Sexual activity. Eating habits. History of falls. Memory and ability to understand (cognition). Work and work Astronomer. Screening  You may have the  following tests or measurements: Height, weight, and BMI. Blood pressure. Lipid and cholesterol levels. These may be checked every 5 years, or more frequently if you are over 19 years old. Skin check. Lung cancer screening. You may have this screening every year starting at age 1 if you have a 30-pack-year history of smoking and currently smoke or have quit within the past 15 years. Fecal occult blood test (FOBT) of the stool. You may have this test every year starting at age 50. Flexible sigmoidoscopy or colonoscopy. You may have a sigmoidoscopy every 5 years or a colonoscopy every 10 years starting at age 15. Prostate cancer screening. Recommendations will vary depending on your family history and other risks. Hepatitis C blood test. Hepatitis B blood test. Sexually transmitted disease (STD) testing. Diabetes screening. This is done by checking your blood sugar (glucose) after you have not eaten for a while (fasting). You may have this done every 1-3 years. Abdominal aortic aneurysm (AAA) screening. You may need this if you are a current or former smoker. Osteoporosis. You may be screened starting at age 38 if you are at high risk. Talk with your health care provider about your test results, treatment options, and if necessary, the need for more tests. Vaccines  Your health care provider may recommend certain vaccines, such as: Influenza vaccine. This is recommended every year. Tetanus, diphtheria, and acellular pertussis (Tdap, Td) vaccine. You may need a Td booster every 10 years. Zoster vaccine. You may need this after age 59. Pneumococcal 13-valent conjugate (PCV13) vaccine. One dose is recommended after age 70. Pneumococcal polysaccharide (PPSV23) vaccine. One dose is recommended after age 75. Talk to your health care provider about which screenings and vaccines you need and how often you need them. This information is not intended to replace advice given to you by your health care  provider. Make sure you discuss any questions you have with your health care provider. Document Released: 09/22/2015 Document Revised: 05/15/2016 Document Reviewed: 06/27/2015 Elsevier Interactive Patient Education  2017 [employer].  Fall Prevention in the Home Falls can cause injuries. They can happen to people of all ages. There are many things you can do to make your home safe and to help prevent falls. What can I do on the outside of my home? Regularly fix the edges of walkways and driveways and fix any cracks. Remove anything that might  make you trip as you walk through a door, such as a raised step or threshold. Trim any bushes or trees on the path to your home. Use bright outdoor lighting. Clear any walking paths of anything that might make someone trip, such as rocks or tools. Regularly check to see if handrails are loose or broken. Make sure that both sides of any steps have handrails. Any raised decks and porches should have guardrails on the edges. Have any leaves, snow, or ice cleared regularly. Use sand or salt on walking paths during winter. Clean up any spills in your garage right away. This includes oil or grease spills. What can I do in the bathroom? Use night lights. Install grab bars by the toilet and in the tub and shower. Do not use towel bars as grab bars. Use non-skid mats or decals in the tub or shower. If you need to sit down in the shower, use a plastic, non-slip stool. Keep the floor dry. Clean up any water that spills on the floor as soon as it happens. Remove soap buildup in the tub or shower regularly. Attach bath mats securely with double-sided non-slip rug tape. Do not have throw rugs and other things on the floor that can make you trip. What can I do in the bedroom? Use night lights. Make sure that you have a light by your bed that is easy to reach. Do not use any sheets or blankets that are too big for your bed. They should not hang down onto the  floor. Have a firm chair that has side arms. You can use this for support while you get dressed. Do not have throw rugs and other things on the floor that can make you trip. What can I do in the kitchen? Clean up any spills right away. Avoid walking on wet floors. Keep items that you use a lot in easy-to-reach places. If you need to reach something above you, use a strong step stool that has a grab bar. Keep electrical cords out of the way. Do not use floor polish or wax that makes floors slippery. If you must use wax, use non-skid floor wax. Do not have throw rugs and other things on the floor that can make you trip. What can I do with my stairs? Do not leave any items on the stairs. Make sure that there are handrails on both sides of the stairs and use them. Fix handrails that are broken or loose. Make sure that handrails are as long as the stairways. Check any carpeting to make sure that it is firmly attached to the stairs. Fix any carpet that is loose or worn. Avoid having throw rugs at the top or bottom of the stairs. If you do have throw rugs, attach them to the floor with carpet tape. Make sure that you have a light switch at the top of the stairs and the bottom of the stairs. If you do not have them, ask someone to add them for you. What else can I do to help prevent falls? Wear shoes that: Do not have high heels. Have rubber bottoms. Are comfortable and fit you well. Are closed at the toe. Do not wear sandals. If you use a stepladder: Make sure that it is fully opened. Do not climb a closed stepladder. Make sure that both sides of the stepladder are locked into place. Ask someone to hold it for you, if possible. Clearly mark and make sure that you can see: Any grab  bars or handrails. First and last steps. Where the edge of each step is. Use tools that help you move around (mobility aids) if they are needed. These include: Canes. Walkers. Scooters. Crutches. Turn on the  lights when you go into a dark area. Replace any light bulbs as soon as they burn out. Set up your furniture so you have a clear path. Avoid moving your furniture around. If any of your floors are uneven, fix them. If there are any pets around you, be aware of where they are. Review your medicines with your doctor. Some medicines can make you feel dizzy. This can increase your chance of falling. Ask your doctor what other things that you can do to help prevent falls. This information is not intended to replace advice given to you by your health care provider. Make sure you discuss any questions you have with your health care provider. Document Released: 06/22/2009 Document Revised: 02/01/2016 Document Reviewed: 09/30/2014 Elsevier Interactive Patient Education  2017 [employer].

## 2023-02-17 ENCOUNTER — Telehealth: Payer: Self-pay

## 2023-02-17 NOTE — Progress Notes (Cosign Needed)
Care Management & Coordination Services Pharmacy Team  Reason for Encounter: Appointment Reminder  Contacted patient to confirm telephone appointment with Cherylin Mylar, PharmD on 02-19-2023 at 9:00. Spoke with patient on 02/17/2023   Do you have any problems getting your medications? No  What is your top health concern you would like to discuss at your upcoming visit? Patient stated no  Have you seen any other providers since your last visit with PCP? No   Chart review: Recent office visits:  01-29-2023 Barb Merino, LPN. Medicare wellness visit.   09-23-2022 [name], FNP. Visit for HTN. Start amoxicillin 875 mg twice daily, Tessalon 100 mg every 6 hors PRN and prednisone pack.  Recent consult visits:  None  Hospital visits:  None in previous 6 months   Star Rating Drugs:  Valsartan/HCTZ 160-12.5 mg- Last filled 02-06-2023 30 [facility]. Previous 01-08-2023 30 DS. Atorvastatin 40 mg- Last filled 02-06-2023 30 [facility]. Previous 01-08-2023 30 DS.   Care Gaps: Annual wellness visit in last year? Yes Covid booster overdue  Huey Romans [facility] Clinical Pharmacist Assistant [phone]

## 2023-02-19 ENCOUNTER — Ambulatory Visit: Payer: Self-pay

## 2023-02-19 NOTE — Progress Notes (Incomplete)
Care Management & Coordination Services Pharmacy Note  02/19/2023 Name:  [name] MRN:  [id number] DOB:  [birth year]  Summary: Recommend patient have a BP check completed in office and have results validated in office due to elevated BP readings at last visit in January. Made PCP team aware of suggestion.   Recommendations/Changes made from today's visit: Recommend Mr. [name] continue to check his 5 days per week and bring readings with him to his next visit. A  Follow up plan: Mr. [name] should have blood work completed at his next visit to include the following: Vitamin D, lipid panel and BMP. Recommend patient review BP readings and have CMA verify BP home cuff with BP cuff  in clinic.  For the next 4 weeks Mr. [name] is going drink at least 6 bottles of water per day.  He is going to get   Subjective: [name] is an 72 y.o. year old male who is a primary patient of Jose Felts, FNP.  The care coordination team was consulted for assistance with disease management and care coordination needs.    Engaged with patient by telephone for follow up visit. He reports today that he was told he does not require any vaccinations.   Recent office visits:  01-29-2023 Barb Merino, LPN. Medicare wellness visit.    09-23-2022 Jose Felts, FNP. Visit for HTN. Start amoxicillin 875 mg twice daily, Tessalon 100 mg every 6 hors PRN and prednisone pack.   Recent consult visits:  None   Hospital visits:  None in previous 6 months   Objective:  Lab Results  Component Value Date   CREATININE 1.22 07/23/2022   BUN 20 07/23/2022   EGFR 63 07/23/2022   GFRNONAA 63 08/21/2020   GFRAA 73 08/21/2020   NA 141 07/23/2022   K 4.7 07/23/2022   CALCIUM 9.5 07/23/2022   CO2 23 07/23/2022   GLUCOSE 92 07/23/2022    Lab Results  Component Value Date/Time   HGBA1C 5.1 08/21/2020 02:53 PM   HGBA1C 4.8 08/04/2019 10:13 AM   MICROALBUR 150 08/21/2020 12:57 PM    Last  diabetic Eye exam: No results found for: "HMDIABEYEEXA"  Last diabetic Foot exam: No results found for: "HMDIABFOOTEX"   Lab Results  Component Value Date   CHOL 152 07/23/2022   HDL 48 07/23/2022   LDLCALC 83 07/23/2022   TRIG 116 07/23/2022   CHOLHDL 3.2 07/23/2022       Latest Ref Rng & Units 07/23/2022    9:35 AM 02/14/2022   12:10 PM 11/14/2020   12:48 PM  Hepatic Function  Total Protein 6.0 - 8.5 g/dL 7.2  7.5  7.2   Albumin 3.8 - 4.8 g/dL 4.7  4.5  4.3   AST 0 - 40 IU/L 25  20  21    ALT 0 - 44 IU/L 31  24  27    Alk Phosphatase 44 - 121 IU/L 74  76  78   Total Bilirubin 0.0 - 1.2 mg/dL 0.6  0.7  0.5     Lab Results  Component Value Date/Time   TSH 1.880 05/20/2022 12:45 PM   TSH 1.660 08/04/2019 10:13 AM       Latest Ref Rng & Units 08/21/2020    2:53 PM 04/24/2009   11:13 AM 01/22/2008    9:05 AM  CBC  WBC 3.4 - 10.8 x10E3/uL 5.1   4.7   Hemoglobin 13.0 - 17.7 g/dL 06.3  01.6  01.0   Hematocrit 37.5 -  51.0 % 46.9   35.0   Platelets 150 - 450 x10E3/uL 148   179     Lab Results  Component Value Date/Time   VD25OH 10.5 (L) 05/20/2022 12:45 PM   VITAMINB12 370 08/04/2019 10:13 AM    Clinical ASCVD: {YES/NO:21197} The ASCVD Risk score (Arnett DK, et al., 2019) failed to calculate for the following reasons:   The systolic blood pressure is missing    ***Other: (CHADS2VASc if Afib, MMRC or CAT for COPD, ACT, DEXA)     01/29/2023    2:39 PM 09/23/2022   11:17 AM 05/20/2022   11:51 AM  Depression screen PHQ 2/9  Decreased Interest 0 0 0  Down, Depressed, Hopeless 0 0 0  PHQ - 2 Score 0 0 0     Social History   Tobacco Use  Smoking Status Never  Smokeless Tobacco Never   BP Readings from Last 3 Encounters:  09/23/22 (!) 142/82  05/21/22 120/80  05/20/22 138/78   Pulse Readings from Last 3 Encounters:  09/23/22 75  05/21/22 79  05/20/22 93   Wt Readings from Last 3 Encounters:  01/29/23 210 lb (95.3 kg)  09/23/22 211 lb (95.7 kg)  05/21/22 212  lb (96.2 kg)   BMI Readings from Last 3 Encounters:  01/29/23 32.41 kg/m  09/23/22 33.05 kg/m  05/21/22 33.20 kg/m    No Known Allergies  Medications Reviewed Today     Reviewed by Barb Merino, LPN (Licensed Practical Nurse) on 01/29/23 at 1436  Med List Status: <None>   Medication Order Taking? Sig Documenting Provider Last Dose Status Informant  amLODipine (NORVASC) 5 MG tablet [id number] Yes TAKE ONE TABLET BY MOUTH EVERYDAY AT BEDTIME Jose Felts, FNP Taking Active   amoxicillin (AMOXIL) 875 MG tablet [id number] No Take 1 tablet (875 mg total) by mouth 2 (two) times daily.  Patient not taking: Reported on 01/29/2023   Jose Felts, FNP Not Taking Active   atorvastatin (LIPITOR) 40 MG tablet [id number] Yes TAKE ONE TABLET BY MOUTH EVERYDAY AT BEDTIME Jose Felts, FNP Taking Active   benzonatate (TESSALON PERLES) 100 MG capsule [id number] No Take 1 capsule (100 mg total) by mouth every 6 (six) hours as needed.  Patient not taking: Reported on 01/29/2023   Jose Felts, FNP Not Taking Active   EPINEPHrine 0.3 mg/0.3 mL IJ SOAJ injection [id number] Yes Inject 0.3 mg into the muscle as needed for anaphylaxis. Jose Felts, FNP Taking Active   predniSONE (STERAPRED UNI-PAK 21 TAB) 10 MG (21) TBPK tablet [id number] No Take as directed  Patient not taking: Reported on 01/29/2023   Jose Felts, FNP Not Taking Active   valsartan-hydrochlorothiazide (DIOVAN-HCT) 160-12.5 MG tablet [id number] Yes TAKE ONE TABLET BY MOUTH EVERY MORNING Jose Felts, FNP Taking Active   Vitamin D, Ergocalciferol, (DRISDOL) 1.25 MG (50000 UNIT) CAPS capsule [id number] Yes TAKE ONE CAPSULE BY MOUTH ONCE WEEKLY BEFORE BREAKFAST ON MONDAY and TAKE ONE CAPSULE BY MOUTH ONCE WEEKLY BEFORE BREAKFAST ON FRIDAYS Jose Felts, FNP Taking Active             SDOH:  (Social Determinants of Health) assessments and interventions performed: No SDOH Interventions    Flowsheet Row Clinical Support from 01/29/2023  in [facility]  SDOH Interventions   Food Insecurity Interventions Intervention Not Indicated  Housing Interventions Intervention Not Indicated  Transportation Interventions Intervention Not Indicated  Utilities Interventions Intervention Not Indicated  Financial Strain Interventions Intervention Not Indicated  Physical Activity Interventions Intervention Not Indicated  Stress Interventions Intervention Not Indicated       Medication Assistance: None required.  Patient affirms current coverage meets needs.  Medication Access: Name and location of current pharmacy:  [id number] [place], [place] - [address] [place] [address] Phone: [phone] Fax: [phone]  [facility] - [place], [place] - [address] [place] [address] Phone: [phone] Fax: [phone]  [facility] - [place], [place] - [address] [place] [address] Phone: [phone] Fax: [phone]  Within the past 30 days, how often has patient missed a dose of medication? None  Is a pillbox or other method used to improve adherence? No  Factors that may affect medication adherence? nonadherence to medications and adverse effects of medications Are meds synced by current pharmacy? No  Are meds delivered by current pharmacy? Yes  Does patient experience delays in picking up medications due to transportation concerns? No   Compliance/Adherence/Medication fill history: Care Gaps: Valsartan/HCTZ 160-12.5 mg- Last filled 02-06-2023 30 [facility]. Previous 01-08-2023 30 DS. Atorvastatin 40 mg- Last filled 02-06-2023 30 [facility]. Previous 01-08-2023 30 DS.  Star-Rating Drugs: Valsartan/HCTZ 160-12.5 mg- Last filled 02-06-2023 30 [facility]. Previous 01-08-2023 30 DS. Atorvastatin 40 mg- Last filled 02-06-2023 30 [facility]. Previous 01-08-2023 30 DS.   Assessment/Plan   Hypertension (BP goal <140/90) -Not ideally controlled -Current treatment: Valsartan -hydrochlorothiazide 160-12.5 mg tablet once per day Appropriate, Query effective Amlodipine 5 mg tablet once per day Appropriate, Query effective -Current home readings: 130/70, 128/70, 129/68, 131/69, 129/68 pulse: 71  -Current dietary habits: he is limiting fried foods and he has increased the amount of water he is drinking.  -Current exercise habits: He is walking at least an hour per day Monday through Friday in the morning  -Denies hypotensive/hypertensive symptoms -Educated on Exercise goal of 150 minutes per week; Importance of home blood pressure monitoring; Proper BP monitoring technique; -Counseled to monitor BP at home at least 5 times per week, document, and provide log at future appointments -Recommended to continue current medication   Cherylin Mylar, CPP, PharmD Clinical Pharmacist Practitioner [facility] [phone]

## 2023-03-12 ENCOUNTER — Encounter: Payer: Self-pay | Admitting: Nurse Practitioner

## 2023-03-12 ENCOUNTER — Ambulatory Visit (INDEPENDENT_AMBULATORY_CARE_PROVIDER_SITE_OTHER): Payer: Medicare Other | Admitting: Nurse Practitioner

## 2023-03-12 VITALS — BP 130/70 | HR 66 | Temp 98.3°F | Ht 67.0 in | Wt 218.0 lb

## 2023-03-12 DIAGNOSIS — E6609 Other obesity due to excess calories: Secondary | ICD-10-CM | POA: Diagnosis not present

## 2023-03-12 DIAGNOSIS — I10 Essential (primary) hypertension: Secondary | ICD-10-CM | POA: Diagnosis not present

## 2023-03-12 DIAGNOSIS — Z23 Encounter for immunization: Secondary | ICD-10-CM

## 2023-03-12 DIAGNOSIS — E782 Mixed hyperlipidemia: Secondary | ICD-10-CM | POA: Diagnosis not present

## 2023-03-12 DIAGNOSIS — E559 Vitamin D deficiency, unspecified: Secondary | ICD-10-CM | POA: Diagnosis not present

## 2023-03-12 DIAGNOSIS — Z6834 Body mass index (BMI) 34.0-34.9, adult: Secondary | ICD-10-CM

## 2023-03-12 MED ORDER — AMLODIPINE BESYLATE 10 MG PO TABS
ORAL_TABLET | ORAL | 1 refills | Status: DC
Start: 2023-03-12 — End: 2023-08-04

## 2023-03-12 NOTE — Progress Notes (Signed)
Madelaine Bhat, CMA,acting as a Neurosurgeon for [name], FNP.,have documented all relevant documentation on the behalf of [name], FNP,as directed by  [name], FNP while in the presence of [name], FNP.  Subjective:  Patient ID: [name] , male    DOB: 29-Oct-1950 , 72 y.o.   MRN: [id number]  Chief Complaint  Patient presents with   Hypertension    HPI     Past Medical History:  Diagnosis Date   Hypertension      Family History  Problem Relation Age of Onset   Dementia Father    Colon cancer Neg Hx    Colon polyps Neg Hx    Esophageal cancer Neg Hx    Rectal cancer Neg Hx    Stomach cancer Neg Hx      Current Outpatient Medications:    atorvastatin (LIPITOR) 40 MG tablet, TAKE ONE TABLET BY MOUTH EVERYDAY AT BEDTIME, Disp: 90 tablet, Rfl: 1   EPINEPHrine 0.3 mg/0.3 mL IJ SOAJ injection, Inject 0.3 mg into the muscle as needed for anaphylaxis., Disp: 1 each, Rfl: 2   valsartan-hydrochlorothiazide (DIOVAN-HCT) 160-12.5 MG tablet, TAKE ONE TABLET BY MOUTH EVERY MORNING, Disp: 90 tablet, Rfl: 1   Vitamin D, Ergocalciferol, (DRISDOL) 1.25 MG (50000 UNIT) CAPS capsule, TAKE ONE CAPSULE BY MOUTH ONCE WEEKLY BEFORE BREAKFAST ON MONDAY and TAKE ONE CAPSULE BY MOUTH ONCE WEEKLY BEFORE BREAKFAST ON FRIDAYS, Disp: 24 capsule, Rfl: 1   amLODipine (NORVASC) 10 MG tablet, TAKE ONE TABLET BY MOUTH EVERYDAY AT BEDTIME, Disp: 90 tablet, Rfl: 1   No Known Allergies   Review of Systems  Constitutional: Negative.   Respiratory: Negative.  Negative for shortness of breath.   Cardiovascular:  Negative for chest pain and palpitations.  Neurological: Negative.  Negative for headaches.  Psychiatric/Behavioral: Negative.       Today's Vitals   03/12/23 1145 03/12/23 1234  BP: (!) 140/80 130/70  Pulse: 66   Temp: 98.3 F (36.8 C)   TempSrc: Oral   Weight: 218 lb (98.9 kg)   Height: 5\' 7"  (1.702 m)   PainSc: 0-No pain    Body mass index is 34.14 kg/m.  Wt  Readings from Last 3 Encounters:  03/12/23 218 lb (98.9 kg)  01/29/23 210 lb (95.3 kg)  09/23/22 211 lb (95.7 kg)      Objective:  Physical Exam Vitals reviewed.  Constitutional:      General: He is not in acute distress.    Appearance: Normal appearance. He is obese.  Cardiovascular:     Rate and Rhythm: Normal rate and regular rhythm.     Pulses: Normal pulses.     Heart sounds: Normal heart sounds. No murmur heard. Pulmonary:     Effort: Pulmonary effort is normal. No respiratory distress.  Skin:    General: Skin is warm and dry.     Capillary Refill: Capillary refill takes less than 2 seconds.     Coloration: Skin is not jaundiced.     Findings: No bruising.  Neurological:     General: No focal deficit present.     Mental Status: He is alert and oriented to person, place, and time.     Cranial Nerves: No cranial nerve deficit.     Motor: No weakness.  Psychiatric:        Mood and Affect: Mood normal.        Behavior: Behavior normal.        Thought Content: Thought content normal.  Judgment: Judgment normal.         Assessment And Plan:  Essential hypertension Assessment & Plan: B/P is slightly elevated, he had not taken his blood pressure medications this morning, repeat was improved  CMP ordered to check renal function.  The importance of regular exercise and dietary modification was stressed to the patient.  Stressed importance of losing ten percent of her body weight to help with B/P control.    Orders: -     Basic metabolic panel -     amLODIPine Besylate; TAKE ONE TABLET BY MOUTH EVERYDAY AT BEDTIME  Dispense: 90 tablet; Refill: 1  Mixed hyperlipidemia Assessment & Plan: Cholesterol levels are slightly improved, continue statin. A low fat, low cholesterol is discussed with the patient, and a written copy is given to him.    Vitamin D deficiency Assessment & Plan: Will check vitamin D level and supplement as needed.    Also encouraged to  spend 15 minutes in the sun daily.    Orders: -     VITAMIN D 25 Hydroxy (Vit-D Deficiency, Fractures)  Class 1 obesity due to excess calories with serious comorbidity and body mass index (BMI) of 34.0 to 34.9 in adult Assessment & Plan: He is encouraged to strive for BMI less than 30 to decrease cardiac risk. Advised to aim for at least 150 minutes of exercise per week.    COVID-19 vaccine administered Assessment & Plan: Covid 19 vaccine given in office observed for 15 minutes without any adverse reaction   Orders: [employer] Fall 2023 Covid-19 Vaccine 33yrs and older    Return for 6 month bp check.  Patient was given opportunity to ask questions. Patient verbalized understanding of the plan and was able to repeat key elements of the plan. All questions were answered to their satisfaction.    Jeanell Sparrow, FNP, have reviewed all documentation for this visit. The documentation on 03/12/23 for the exam, diagnosis, procedures, and orders are all accurate and complete.   IF YOU HAVE BEEN REFERRED TO A SPECIALIST, IT MAY TAKE 1-2 WEEKS TO SCHEDULE/PROCESS THE REFERRAL. IF YOU HAVE NOT HEARD FROM US/SPECIALIST IN TWO WEEKS, PLEASE GIVE [place] A CALL AT [phone].

## 2023-03-14 LAB — BASIC METABOLIC PANEL
BUN/Creatinine Ratio: 13 (ref 10–24)
BUN: 14 mg/dL (ref 8–27)
CO2: 25 mmol/L (ref 20–29)
Calcium: 9.2 mg/dL (ref 8.6–10.2)
Chloride: 100 mmol/L (ref 96–106)
Creatinine, Ser: 1.07 mg/dL (ref 0.76–1.27)
Glucose: 75 mg/dL (ref 70–99)
Potassium: 4.4 mmol/L (ref 3.5–5.2)
Sodium: 139 mmol/L (ref 134–144)
eGFR: 74 mL/min/{1.73_m2} (ref >59)

## 2023-03-14 LAB — VITAMIN D 25 HYDROXY (VIT D DEFICIENCY, FRACTURES): Vit D, 25-Hydroxy: 49 ng/mL (ref 30.0–100.0)

## 2023-04-04 DIAGNOSIS — Z23 Encounter for immunization: Secondary | ICD-10-CM | POA: Insufficient documentation

## 2023-04-04 NOTE — Assessment & Plan Note (Signed)
Will check vitamin D level and supplement as needed.    Also encouraged to spend 15 minutes in the sun daily.   

## 2023-04-04 NOTE — Assessment & Plan Note (Addendum)
B/P is slightly elevated, he had not taken his blood pressure medications this morning, repeat was improved  CMP ordered to check renal function.  The importance of regular exercise and dietary modification was stressed to the patient.  Stressed importance of losing ten percent of her body weight to help with B/P control.

## 2023-04-04 NOTE — Assessment & Plan Note (Signed)
Cholesterol levels are slightly improved, continue statin. A low fat, low cholesterol is discussed with the patient, and a written copy is given to him.

## 2023-04-04 NOTE — Assessment & Plan Note (Signed)
Covid 19 vaccine given in office observed for 15 minutes without any adverse reaction  

## 2023-04-04 NOTE — Assessment & Plan Note (Signed)
He is encouraged to strive for BMI less than 30 to decrease cardiac risk. Advised to aim for at least 150 minutes of exercise per week.  

## 2023-05-15 ENCOUNTER — Other Ambulatory Visit: Payer: Self-pay | Admitting: Nurse Practitioner

## 2023-05-15 DIAGNOSIS — E559 Vitamin D deficiency, unspecified: Secondary | ICD-10-CM

## 2023-05-15 DIAGNOSIS — Z91030 Bee allergy status: Secondary | ICD-10-CM

## 2023-07-10 ENCOUNTER — Other Ambulatory Visit: Payer: Self-pay

## 2023-07-10 DIAGNOSIS — E559 Vitamin D deficiency, unspecified: Secondary | ICD-10-CM

## 2023-07-10 MED ORDER — VITAMIN D (ERGOCALCIFEROL) 1.25 MG (50000 UNIT) PO CAPS: 50000.0000 [IU] | ORAL_CAPSULE | ORAL | 3 refills | Status: DC

## 2023-08-02 ENCOUNTER — Other Ambulatory Visit: Payer: Self-pay | Admitting: Nurse Practitioner

## 2023-08-02 DIAGNOSIS — I10 Essential (primary) hypertension: Secondary | ICD-10-CM

## 2023-08-05 ENCOUNTER — Other Ambulatory Visit: Payer: Self-pay | Admitting: Nurse Practitioner

## 2023-08-05 DIAGNOSIS — I10 Essential (primary) hypertension: Secondary | ICD-10-CM

## 2023-09-15 ENCOUNTER — Encounter: Payer: Self-pay | Admitting: Nurse Practitioner

## 2023-09-15 ENCOUNTER — Ambulatory Visit (INDEPENDENT_AMBULATORY_CARE_PROVIDER_SITE_OTHER): Payer: Medicare Other | Admitting: Nurse Practitioner

## 2023-09-15 VITALS — BP 120/70 | HR 89 | Temp 98.6°F | Ht 67.0 in | Wt 215.4 lb

## 2023-09-15 DIAGNOSIS — E559 Vitamin D deficiency, unspecified: Secondary | ICD-10-CM

## 2023-09-15 DIAGNOSIS — E782 Mixed hyperlipidemia: Secondary | ICD-10-CM

## 2023-09-15 DIAGNOSIS — E66811 Obesity, class 1: Secondary | ICD-10-CM

## 2023-09-15 DIAGNOSIS — I10 Essential (primary) hypertension: Secondary | ICD-10-CM | POA: Diagnosis not present

## 2023-09-15 DIAGNOSIS — R051 Acute cough: Secondary | ICD-10-CM | POA: Diagnosis not present

## 2023-09-15 DIAGNOSIS — Z6833 Body mass index (BMI) 33.0-33.9, adult: Secondary | ICD-10-CM

## 2023-09-15 DIAGNOSIS — E6609 Other obesity due to excess calories: Secondary | ICD-10-CM

## 2023-09-15 MED ORDER — BENZONATATE 100 MG PO CAPS: 100.0000 mg | ORAL_CAPSULE | Freq: Three times a day (TID) | ORAL | 2 refills | Status: AC | PRN

## 2023-09-15 NOTE — Assessment & Plan Note (Signed)
 Cholesterol levels was normal at last check, continue statin. Continue low fat diet

## 2023-09-15 NOTE — Assessment & Plan Note (Signed)
 Blood pressure is well controlled, continue current medications.

## 2023-09-15 NOTE — Assessment & Plan Note (Signed)
 Will check vitamin D level and supplement as needed.    Also encouraged to spend 15 minutes in the sun daily.

## 2023-09-15 NOTE — Assessment & Plan Note (Signed)
 He is encouraged to strive for BMI less than 30 to decrease cardiac risk. Advised to aim for at least 150 minutes of exercise per week. Continue walking daily.

## 2023-09-15 NOTE — Progress Notes (Signed)
 LILLETTE Kristeen JINNY Gladis, CMA,acting as a neurosurgeon for Jose Ada, FNP.,have documented all relevant documentation on the behalf of Jose Ada, FNP,as directed by  Jose Ada, FNP while in the presence of Jose Ada, FNP.  Subjective:  Patient ID: [name] , male    DOB: [birth year] , 73 y.o.   MRN: [id number]  Chief Complaint  Patient presents with   Hypertension    HPI  Patient presents today for a bp and chol follow up. Patient reports compliance with medication. He has taken his medications today. Patient reports he has had a cough since Sunday. He is drinking about 3 bottles of water a day and walking 3 miles a day. He is drinking and eating dairy products. He has not tested for covid, denies being around people.     Past Medical History:  Diagnosis Date   Hypertension      Family History  Problem Relation Age of Onset   Dementia Father    Colon cancer Neg Hx    Colon polyps Neg Hx    Esophageal cancer Neg Hx    Rectal cancer Neg Hx    Stomach cancer Neg Hx      Current Outpatient Medications:    amLODipine  (NORVASC ) 10 MG tablet, TAKE 1 TABLET BY MOUTH EVERY DAY AT BEDTIME, Disp: 30 tablet, Rfl: 10   atorvastatin  (LIPITOR) 40 MG tablet, TAKE 1 TABLET BY MOUTH EVERY DAY AT BEDTIME, Disp: 30 tablet, Rfl: 10   EPINEPHRINE  0.3 mg/0.3 mL IJ SOAJ injection, INJECT 0.3 MG INTRAMUSCULARLY AS NEEDED FOR ANAPHYLAXIS *REFILL REQUEST*, Disp: 2 each, Rfl: 10   valsartan -hydrochlorothiazide  (DIOVAN -HCT) 160-12.5 MG tablet, TAKE 1 TABLET BY MOUTH EVERY MORNING, Disp: 30 tablet, Rfl: 10   Vitamin D , Ergocalciferol , (DRISDOL ) 1.25 MG (50000 UNIT) CAPS capsule, Take 1 capsule (50,000 Units total) by mouth every 7 (seven) days., Disp: 3 capsule, Rfl: 3   benzonatate  (TESSALON ) 100 MG capsule, Take 1 capsule (100 mg total) by mouth 3 (three) times daily as needed for cough., Disp: 30 capsule, Rfl: 2   No Known Allergies   Review of Systems  Constitutional:  Positive for fatigue.   Respiratory:  Positive for cough. Negative for shortness of breath and wheezing.   Cardiovascular:  Negative for chest pain and palpitations.  Neurological: Negative.  Negative for headaches.  Psychiatric/Behavioral: Negative.       Today's Vitals   09/15/23 1139 09/15/23 1201  BP: (!) 120/90 120/70  Pulse: 89   Temp: 98.6 F (37 C)   TempSrc: Oral   Weight: 215 lb 6.4 oz (97.7 kg)   Height: 5' 7 (1.702 m)   PainSc: 0-No pain    Body mass index is 33.74 kg/m.  Wt Readings from Last 3 Encounters:  09/15/23 215 lb 6.4 oz (97.7 kg)  03/12/23 218 lb (98.9 kg)  01/29/23 210 lb (95.3 kg)     Objective:  Physical Exam Vitals reviewed.  Constitutional:      General: He is not in acute distress.    Appearance: Normal appearance. He is obese.  Cardiovascular:     Rate and Rhythm: Normal rate and regular rhythm.     Pulses: Normal pulses.     Heart sounds: Normal heart sounds. No murmur heard. Pulmonary:     Effort: Pulmonary effort is normal. No respiratory distress.     Breath sounds: Normal breath sounds. No wheezing.  Skin:    General: Skin is warm and dry.     Capillary Refill: Capillary  refill takes less than 2 seconds.     Coloration: Skin is not jaundiced.     Findings: No bruising.  Neurological:     General: No focal deficit present.     Mental Status: He is alert and oriented to person, place, and time.     Cranial Nerves: No cranial nerve deficit.     Motor: No weakness.  Psychiatric:        Mood and Affect: Mood normal.        Behavior: Behavior normal.        Thought Content: Thought content normal.        Judgment: Judgment normal.         Assessment And Plan:  Essential hypertension Assessment & Plan: Blood pressure is well controlled, continue current medications   Orders: -     BMP8+eGFR -     Benzonatate ; Take 1 capsule (100 mg total) by mouth 3 (three) times daily as needed for cough.  Dispense: 30 capsule; Refill: 2  Mixed  hyperlipidemia Assessment & Plan: Cholesterol levels was normal at last check, continue statin. Continue low fat diet   Orders: -     Lipid panel  Acute cough  Class 1 obesity due to excess calories with serious comorbidity and body mass index (BMI) of 33.0 to 33.9 in adult  Vitamin D  deficiency Assessment & Plan: Will check vitamin D  level and supplement as needed.    Also encouraged to spend 15 minutes in the sun daily.     Class 1 obesity due to excess calories with serious comorbidity and body mass index (BMI) of 34.0 to 34.9 in adult Assessment & Plan: He is encouraged to strive for BMI less than 30 to decrease cardiac risk. Advised to aim for at least 150 minutes of exercise per week. Continue walking daily.      Return for 6 month bp check.   Patient was given opportunity to ask questions. Patient verbalized understanding of the plan and was able to repeat key elements of the plan. All questions were answered to their satisfaction.    LILLETTE Jose Ada, FNP, have reviewed all documentation for this visit. The documentation on 09/15/23 for the exam, diagnosis, procedures, and orders are all accurate and complete.   IF YOU HAVE BEEN REFERRED TO A SPECIALIST, IT MAY TAKE 1-2 WEEKS TO SCHEDULE/PROCESS THE REFERRAL. IF YOU HAVE NOT HEARD FROM US /SPECIALIST IN TWO WEEKS, PLEASE GIVE US  A CALL AT [phone].

## 2023-09-16 LAB — BMP8+EGFR
BUN/Creatinine Ratio: 14 (ref 10–24)
BUN: 16 mg/dL (ref 8–27)
CO2: 25 mmol/L (ref 20–29)
Calcium: 8.8 mg/dL (ref 8.6–10.2)
Chloride: 102 mmol/L (ref 96–106)
Creatinine, Ser: 1.11 mg/dL (ref 0.76–1.27)
Glucose: 80 mg/dL (ref 70–99)
Potassium: 4 mmol/L (ref 3.5–5.2)
Sodium: 141 mmol/L (ref 134–144)
eGFR: 70 mL/min/{1.73_m2} (ref >59)

## 2023-09-16 LAB — LIPID PANEL
Chol/HDL Ratio: 3.4 {ratio} (ref 0.0–5.0)
Cholesterol, Total: 140 mg/dL (ref 100–199)
HDL: 41 mg/dL (ref >39)
LDL Chol Calc (NIH): 76 mg/dL (ref 0–99)
Triglycerides: 132 mg/dL (ref 0–149)
VLDL Cholesterol Cal: 23 mg/dL (ref 5–40)

## 2023-10-20 ENCOUNTER — Other Ambulatory Visit: Payer: Self-pay | Admitting: Nurse Practitioner

## 2023-10-20 DIAGNOSIS — E559 Vitamin D deficiency, unspecified: Secondary | ICD-10-CM

## 2024-01-30 ENCOUNTER — Ambulatory Visit (INDEPENDENT_AMBULATORY_CARE_PROVIDER_SITE_OTHER)

## 2024-01-30 DIAGNOSIS — Z0000 Encounter for general adult medical examination without abnormal findings: Secondary | ICD-10-CM

## 2024-01-30 NOTE — Progress Notes (Signed)
 Subjective:   [name] is a 73 y.o. male who presents for Medicare Annual/Subsequent preventive examination.  Visit Complete: Virtual I connected with  [name] on 01/30/24 by a audio enabled telemedicine application and verified that I am speaking with the correct person using two identifiers.  Patient Location: Home  Provider Location: Office/Clinic  I discussed the limitations of evaluation and management by telemedicine. The patient expressed understanding and agreed to proceed.  Vital Signs: Because this visit was a virtual/telehealth visit, some criteria may be missing or patient reported. Any vitals not documented were not able to be obtained and vitals that have been documented are patient reported.  Patient Medicare AWV questionnaire was completed by the patient on 01/30/2024; I have confirmed that all information answered by patient is correct and no changes since this date.        Objective:     There were no vitals filed for this visit. There is no height or weight on file to calculate BMI.     01/29/2023    2:38 PM 01/18/2022    9:33 AM  Advanced Directives  Does Patient Have a Medical Advance Directive? No No    Current Medications (verified) Outpatient Encounter Medications as of 01/30/2024  Medication Sig   amLODipine  (NORVASC ) 10 MG tablet TAKE 1 TABLET BY MOUTH EVERY DAY AT BEDTIME   atorvastatin  (LIPITOR) 40 MG tablet TAKE 1 TABLET BY MOUTH EVERY DAY AT BEDTIME   benzonatate  (TESSALON ) 100 MG capsule Take 1 capsule (100 mg total) by mouth 3 (three) times daily as needed for cough.   EPINEPHRINE  0.3 mg/0.3 mL IJ SOAJ injection INJECT 0.3 MG INTRAMUSCULARLY AS NEEDED FOR ANAPHYLAXIS *REFILL REQUEST*   valsartan -hydrochlorothiazide  (DIOVAN -HCT) 160-12.5 MG tablet TAKE 1 TABLET BY MOUTH EVERY MORNING   Vitamin D , Ergocalciferol , (DRISDOL ) 1.25 MG (50000 UNIT) CAPS capsule TAKE 1 CAPSULE BY MOUTH EVERY 7 DAYS   No facility-administered  encounter medications on file as of 01/30/2024.    Allergies (verified) Patient has no known allergies.   History: Past Medical History:  Diagnosis Date   Hypertension    Past Surgical History:  Procedure Laterality Date   COLONOSCOPY  2005   COLONOSCOPY  10/06/2019   Family History  Problem Relation Age of Onset   Dementia Father    Colon cancer Neg Hx    Colon polyps Neg Hx    Esophageal cancer Neg Hx    Rectal cancer Neg Hx    Stomach cancer Neg Hx    Social History   Socioeconomic History   Marital status: Married    Spouse name: Not on file   Number of children: Not on file   Years of education: Not on file   Highest education level: Not on file  Occupational History   Not on file  Tobacco Use   Smoking status: Never   Smokeless tobacco: Never  Vaping Use   Vaping status: Never Used  Substance and Sexual Activity   Alcohol use: Not Currently   Drug use: Never   Sexual activity: Not on file  Other Topics Concern   Not on file  Social History Narrative   Not on file   Social Drivers of Health   Financial Resource Strain: Low Risk  (01/29/2023)   Overall Financial Resource Strain (CARDIA)    Difficulty of Paying Living Expenses: Not hard at all  Food Insecurity: No Food Insecurity (01/29/2023)   Hunger Vital Sign    Worried About Running Out  of Food in the Last Year: Never true    Ran Out of Food in the Last Year: Never true  Transportation Needs: No Transportation Needs (01/29/2023)   PRAPARE - Administrator, Civil Service (Medical): No    Lack of Transportation (Non-Medical): No  Physical Activity: Sufficiently Active (01/29/2023)   Exercise Vital Sign    Days of Exercise per Week: 5 days    Minutes of Exercise per Session: 90 min  Stress: No Stress Concern Present (01/29/2023)   [employer] of Occupational Health - Occupational Stress Questionnaire    Feeling of Stress : Not at all  Social Connections: Unknown (08/04/2019)    Social Connection and Isolation Panel [NHANES]    Frequency of Communication with Friends and Family: Patient declined    Frequency of Social Gatherings with Friends and Family: Patient declined    Attends Religious Services: Patient declined    Database administrator or Organizations: Patient declined    Attends Banker Meetings: Patient declined    Marital Status: Patient declined    Tobacco Counseling Counseling given: Not Answered   Clinical Intake:                        Activities of Daily Living     No data to display          Patient Care Team: Susanna Epley, FNP as PCP - General (General Practice) Pearson, Vallie J, [facility] (Inactive) (Pharmacist)  Indicate any recent Medical Services you may have received from other than [facility] providers in the past year (date may be approximate).     Assessment:    This is a routine wellness examination for [name].  Hearing/Vision screen No results found.   Goals Addressed   None    Depression Screen    01/29/2023    2:39 PM 09/23/2022   11:17 AM 05/20/2022   11:51 AM 01/18/2022    9:34 AM 11/05/2021    2:10 PM 08/21/2020    9:49 AM 08/04/2019    9:13 AM  PHQ 2/9 Scores  PHQ - 2 Score 0 0 0 0 0 0 0  PHQ- 9 Score     0      Fall Risk    03/12/2023   11:44 AM 01/29/2023    2:39 PM 09/23/2022   11:16 AM 05/20/2022   11:51 AM 01/18/2022    9:34 AM  Fall Risk   Falls in the past year? 0 0 0 0 0  Number falls in past yr: 0 0 0 0 0  Injury with Fall? 0 0 0 0 0  Risk for fall due to : No Fall Risks Medication side effect No Fall Risks No Fall Risks No Fall Risks  Follow up Falls evaluation completed;Education provided;Falls prevention discussed Falls prevention discussed;Education provided;Falls evaluation completed Falls evaluation completed Falls evaluation completed Falls evaluation completed;Education provided;Falls prevention discussed    MEDICARE RISK AT HOME:    TIMED UP AND GO:  Was  the test performed?  No    Cognitive Function:        01/29/2023    2:39 PM 01/18/2022    9:35 AM  6CIT Screen  What Year? 0 points 0 points  What month? 0 points 0 points  What time? 0 points 0 points  Count back from 20 0 points 0 points  Months in reverse 0 points 0 points  Repeat phrase 2 points 0 points  Total  Score 2 points 0 points    Immunizations Immunization History  Administered Date(s) Administered   Fluad Quad(high Dose 65+) 08/16/2020, 05/28/2021, 05/21/2022   Influenza, High Dose Seasonal PF 07/07/2019, 08/05/2023   Moderna Covid-19 Fall Seasonal Vaccine 82yrs & older 08/05/2023   PFIZER Comirnaty(Gray Top)Covid-19 Tri-Sucrose Vaccine 03/16/2021   PFIZER(Purple Top)SARS-COV-2 Vaccination 01/14/2020, 02/08/2020, 09/15/2020   PNEUMOCOCCAL CONJUGATE-20 05/31/2021   Pfizer Covid-19 Vaccine Bivalent Booster 70yrs & up 07/01/2022   Pfizer(Comirnaty)Fall Seasonal Vaccine 12 years and older 03/12/2023   Tdap 10/12/2019   Zoster Recombinant(Shingrix ) 03/16/2021, 06/01/2021    TDAP status: Up to date  Flu Vaccine status: Up to date  Pneumococcal vaccine status: Up to date  Covid-19 vaccine status: Information provided on how to obtain vaccines.   Qualifies for Shingles Vaccine? Yes   Zostavax completed Yes   Shingrix  Completed?: Yes  Screening Tests Health Maintenance  Topic Date Due   Medicare Annual Wellness (AWV)  01/29/2024   COVID-19 Vaccine (8 - Pfizer risk 2024-25 season) 02/02/2024   INFLUENZA VACCINE  04/09/2024   Colonoscopy  10/05/2029   DTaP/Tdap/Td (2 - Td or Tdap) 10/11/2029   Pneumonia Vaccine 73+ Years old  Completed   Hepatitis C Screening  Completed   Zoster Vaccines- Shingrix   Completed   HPV VACCINES  Aged Out   Meningococcal B Vaccine  Aged Out    Health Maintenance  Health Maintenance Due  Topic Date Due   Medicare Annual Wellness (AWV)  01/29/2024   COVID-19 Vaccine (8 - Pfizer risk 2024-25 season) 02/02/2024    Colorectal  cancer screening: Type of screening: Colonoscopy. Completed 10/06/2019. Repeat every 10 years  Lung Cancer Screening: (Low Dose CT Chest recommended if Age 67-80 years, 20 pack-year currently smoking OR have quit w/in 15years.) does not qualify.   Lung Cancer Screening Referral: n/a  Additional Screening:  Hepatitis C Screening: does qualify; Completed 08/04/2019  Vision Screening: Recommended annual ophthalmology exams for early detection of glaucoma and other disorders of the eye. Is the patient up to date with their annual eye exam?  Yes  Who is the provider or what is the name of the office in which the patient attends annual eye exams? [email or web address] If pt is not established with a provider, would they like to be referred to a provider to establish care? No .   Dental Screening: Recommended annual dental exams for proper oral hygiene  Diabetic Foot Exam: not diabetic  Community Resource Referral / Chronic Care Management: CRR required this visit?  No   CCM required this visit?  No     Plan:     I have personally reviewed and noted the following in the patient's chart:   Medical and social history Use of alcohol, tobacco or illicit drugs  Current medications and supplements including opioid prescriptions. Patient is not currently taking opioid prescriptions. Functional ability and status Nutritional status Physical activity Advanced directives List of other physicians Hospitalizations, surgeries, and ER visits in previous 12 months Vitals Screenings to include cognitive, depression, and falls Referrals and appointments  In addition, I have reviewed and discussed with patient certain preventive protocols, quality metrics, and best practice recommendations. A written personalized care plan for preventive services as well as general preventive health recommendations were provided to patient.     Alexis Anger, CMA   01/30/2024   After Visit Summary: (Declined) Due to  this being a telephonic visit, with patients personalized plan was offered to patient but patient Declined AVS at this time  Nurse Notes:

## 2024-03-15 ENCOUNTER — Encounter: Payer: Self-pay | Admitting: Nurse Practitioner

## 2024-03-15 ENCOUNTER — Ambulatory Visit (INDEPENDENT_AMBULATORY_CARE_PROVIDER_SITE_OTHER): Payer: Medicare Other | Admitting: Nurse Practitioner

## 2024-03-15 VITALS — BP 140/74 | HR 64 | Temp 98.7°F | Ht 67.0 in | Wt 222.2 lb

## 2024-03-15 DIAGNOSIS — Z2821 Immunization not carried out because of patient refusal: Secondary | ICD-10-CM | POA: Insufficient documentation

## 2024-03-15 DIAGNOSIS — E782 Mixed hyperlipidemia: Secondary | ICD-10-CM | POA: Diagnosis not present

## 2024-03-15 DIAGNOSIS — I10 Essential (primary) hypertension: Secondary | ICD-10-CM

## 2024-03-15 DIAGNOSIS — Z5320 Procedure and treatment not carried out because of patient's decision for unspecified reasons: Secondary | ICD-10-CM

## 2024-03-15 DIAGNOSIS — G479 Sleep disorder, unspecified: Secondary | ICD-10-CM | POA: Diagnosis not present

## 2024-03-15 DIAGNOSIS — E66811 Obesity, class 1: Secondary | ICD-10-CM

## 2024-03-15 DIAGNOSIS — E6609 Other obesity due to excess calories: Secondary | ICD-10-CM

## 2024-03-15 DIAGNOSIS — Z6834 Body mass index (BMI) 34.0-34.9, adult: Secondary | ICD-10-CM

## 2024-03-15 DIAGNOSIS — Z79899 Other long term (current) drug therapy: Secondary | ICD-10-CM

## 2024-03-15 NOTE — Progress Notes (Signed)
 cdI,Ciera JINNY Lunger, CMA,acting as a scribe for [name], FNP.,have documented all relevant documentation on the behalf of [name], FNP,as directed by  [name], FNP while in the presence of [name], FNP.  Subjective:  Patient ID: [name] , male    DOB: 11/11/1950 , 73 y.o.   MRN: [id number]  Chief Complaint  Patient presents with   Hypertension    Patient presents today for a bp and chol follow up, Patient reports compliance with medication. Patient denies any chest pain, SOB, or headaches. Patient has no concerns today.     HPI  Discussed the use of AI scribe software for clinical note transcription with the patient, who gave verbal consent to proceed.  History of Present Illness The patient presents for a follow-up visit regarding blood pressure management.  He has no issues with his blood pressure and does not require any medication refills. He admits to not taking his medications regularly.  He experiences slight swelling in his left leg.  Since retirement, he sleeps only two to three hours at night compared to six hours when he was working. He takes naps during the day, especially after eating. He has not been tested for sleep apnea and is unsure if he has it. He rarely falls asleep while watching TV and does not drive often. No frequent daytime sleepiness except after eating.  He continues to exercise regularly, going for walks every morning. He did not walk last Thursday but resumed on Monday.   Past Medical History:  Diagnosis Date   Hypertension      Family History  Problem Relation Age of Onset   Dementia Father    Colon cancer Neg Hx    Colon polyps Neg Hx    Esophageal cancer Neg Hx    Rectal cancer Neg Hx    Stomach cancer Neg Hx      Current Outpatient Medications:    amLODipine  (NORVASC ) 10 MG tablet, TAKE 1 TABLET BY MOUTH EVERY DAY AT BEDTIME, Disp: 30 tablet, Rfl: 10   atorvastatin  (LIPITOR) 40 MG tablet, TAKE 1 TABLET BY MOUTH  EVERY DAY AT BEDTIME, Disp: 30 tablet, Rfl: 10   benzonatate  (TESSALON ) 100 MG capsule, Take 1 capsule (100 mg total) by mouth 3 (three) times daily as needed for cough., Disp: 30 capsule, Rfl: 2   EPINEPHRINE  0.3 mg/0.3 mL IJ SOAJ injection, INJECT 0.3 MG INTRAMUSCULARLY AS NEEDED FOR ANAPHYLAXIS *REFILL REQUEST*, Disp: 2 each, Rfl: 10   valsartan -hydrochlorothiazide  (DIOVAN -HCT) 160-12.5 MG tablet, TAKE 1 TABLET BY MOUTH EVERY MORNING, Disp: 30 tablet, Rfl: 10   Vitamin D , Ergocalciferol , (DRISDOL ) 1.25 MG (50000 UNIT) CAPS capsule, TAKE 1 CAPSULE BY MOUTH EVERY 7 DAYS, Disp: 3 capsule, Rfl: 10   No Known Allergies   Review of Systems  Constitutional:  Negative for fatigue.  Respiratory:  Negative for cough, shortness of breath and wheezing.   Cardiovascular:  Negative for chest pain and palpitations.  Neurological: Negative.  Negative for headaches.  Psychiatric/Behavioral: Negative.       Today's Vitals   03/15/24 1152 03/15/24 1215  BP: (!) 156/78 (!) 140/74  Pulse: 64   Temp: 98.7 F (37.1 C)   TempSrc: Oral   Weight: 222 lb 3.2 oz (100.8 kg)   Height: 5' 7 (1.702 m)   PainSc: 0-No pain    Body mass index is 34.8 kg/m.  Wt Readings from Last 3 Encounters:  03/15/24 222 lb 3.2 oz (100.8 kg)  09/15/23 215 lb 6.4  oz (97.7 kg)  03/12/23 218 lb (98.9 kg)     Objective:  Physical Exam Vitals and nursing note reviewed.  Constitutional:      General: He is not in acute distress.    Appearance: Normal appearance. He is obese.  Cardiovascular:     Rate and Rhythm: Normal rate and regular rhythm.     Pulses: Normal pulses.     Heart sounds: Normal heart sounds. No murmur heard. Pulmonary:     Effort: Pulmonary effort is normal. No respiratory distress.     Breath sounds: Normal breath sounds. No wheezing.  Skin:    General: Skin is warm and dry.     Capillary Refill: Capillary refill takes less than 2 seconds.     Coloration: Skin is not jaundiced.     Findings: No  bruising.  Neurological:     General: No focal deficit present.     Mental Status: He is alert and oriented to person, place, and time.     Cranial Nerves: No cranial nerve deficit.     Motor: No weakness.  Psychiatric:        Mood and Affect: Mood normal.        Behavior: Behavior normal.        Thought Content: Thought content normal.        Judgment: Judgment normal.         Assessment And Plan:  Essential hypertension Assessment & Plan: Blood pressure elevated today repeat improved but remains elevated. He is advised to take his medications regularly. No medication refills needed.   Orders: -     BMP8+eGFR  Mixed hyperlipidemia Assessment & Plan: Cholesterol levels was normal at last check, continue statin. Continue low fat diet   Orders: -     Lipid panel  Sleep disturbance Assessment & Plan: Suspected sleep apnea due to daytime sleepiness. Educated on risks of untreated sleep apnea, including cardiovascular events and quality of life impact. - Discuss potential sleep apnea testing. - Educate on risks of untreated sleep apnea.   Class 1 obesity due to excess calories with serious comorbidity and body mass index (BMI) of 34.0 to 34.9 in adult Assessment & Plan: He is encouraged to strive for BMI less than 30 to decrease cardiac risk. Advised to aim for at least 150 minutes of exercise per week. Continue walking daily.     Treatment declined by patient Assessment & Plan: Offered to order sleep study however he declines to have one done. Discussed risk of possible sleep apnea and cardiac events   Other long term (current) drug therapy -     CBC    Return for 6 month bp check.  Patient was given opportunity to ask questions. Patient verbalized understanding of the plan and was able to repeat key elements of the plan. All questions were answered to their satisfaction.    LILLETTE [name], FNP, have reviewed all documentation for this visit. The documentation  on 03/15/24 for the exam, diagnosis, procedures, and orders are all accurate and complete.   IF YOU HAVE BEEN REFERRED TO A SPECIALIST, IT MAY TAKE 1-2 WEEKS TO SCHEDULE/PROCESS THE REFERRAL. IF YOU HAVE NOT HEARD FROM US /SPECIALIST IN TWO WEEKS, PLEASE GIVE US  A CALL AT [phone].

## 2024-03-15 NOTE — Assessment & Plan Note (Signed)
 Suspected sleep apnea due to daytime sleepiness. Educated on risks of untreated sleep apnea, including cardiovascular events and quality of life impact. - Discuss potential sleep apnea testing. - Educate on risks of untreated sleep apnea.

## 2024-03-15 NOTE — Assessment & Plan Note (Signed)
 He is encouraged to strive for BMI less than 30 to decrease cardiac risk. Advised to aim for at least 150 minutes of exercise per week. Continue walking daily.

## 2024-03-15 NOTE — Assessment & Plan Note (Signed)
 Offered to order sleep study however he declines to have one done. Discussed risk of possible sleep apnea and cardiac events

## 2024-03-15 NOTE — Assessment & Plan Note (Signed)
 Blood pressure elevated today repeat improved but remains elevated. He is advised to take his medications regularly. No medication refills needed.

## 2024-03-15 NOTE — Assessment & Plan Note (Signed)
 Cholesterol levels was normal at last check, continue statin. Continue low fat diet

## 2024-03-15 NOTE — Patient Instructions (Signed)
 Let us  know if you change your mind about having a sleep study Be sure to take her medications as directed

## 2024-03-16 LAB — LIPID PANEL
Chol/HDL Ratio: 3.5 ratio (ref 0.0–5.0)
Cholesterol, Total: 143 mg/dL (ref 100–199)
HDL: 41 mg/dL (ref >39)
LDL Chol Calc (NIH): 77 mg/dL (ref 0–99)
Triglycerides: 141 mg/dL (ref 0–149)
VLDL Cholesterol Cal: 25 mg/dL (ref 5–40)

## 2024-03-16 LAB — BMP8+EGFR
BUN/Creatinine Ratio: 15 (ref 10–24)
BUN: 17 mg/dL (ref 8–27)
CO2: 22 mmol/L (ref 20–29)
Calcium: 9 mg/dL (ref 8.6–10.2)
Chloride: 102 mmol/L (ref 96–106)
Creatinine, Ser: 1.14 mg/dL (ref 0.76–1.27)
Glucose: 88 mg/dL (ref 70–99)
Potassium: 3.9 mmol/L (ref 3.5–5.2)
Sodium: 142 mmol/L (ref 134–144)
eGFR: 68 mL/min/1.73 (ref >59)

## 2024-03-16 LAB — CBC
Hematocrit: 47.4 % (ref 37.5–51.0)
Hemoglobin: 15.3 g/dL (ref 13.0–17.7)
MCH: 31.7 pg (ref 26.6–33.0)
MCHC: 32.3 g/dL (ref 31.5–35.7)
MCV: 98 fL — ABNORMAL HIGH (ref 79–97)
Platelets: 152 x10E3/uL (ref 150–450)
RBC: 4.82 x10E6/uL (ref 4.14–5.80)
RDW: 11.9 % (ref 11.6–15.4)
WBC: 5.8 x10E3/uL (ref 3.4–10.8)

## 2024-03-28 ENCOUNTER — Ambulatory Visit: Payer: Self-pay | Admitting: Nurse Practitioner

## 2024-04-15 ENCOUNTER — Other Ambulatory Visit: Payer: Self-pay | Admitting: Nurse Practitioner

## 2024-07-05 ENCOUNTER — Ambulatory Visit (INDEPENDENT_AMBULATORY_CARE_PROVIDER_SITE_OTHER)

## 2024-07-05 VITALS — BP 130/80 | HR 85 | Temp 98.6°F | Ht 67.0 in | Wt 222.0 lb

## 2024-07-05 DIAGNOSIS — Z23 Encounter for immunization: Secondary | ICD-10-CM | POA: Diagnosis not present

## 2024-07-05 NOTE — Progress Notes (Signed)
 Patient is in office today for a nurse visit for flu Immunization. Patient Injection was given in the  Left deltoid. Patient tolerated injection well.

## 2024-08-18 ENCOUNTER — Other Ambulatory Visit: Payer: Self-pay | Admitting: Nurse Practitioner

## 2024-08-18 DIAGNOSIS — I10 Essential (primary) hypertension: Secondary | ICD-10-CM

## 2024-09-15 ENCOUNTER — Encounter: Payer: Self-pay | Admitting: Nurse Practitioner

## 2024-09-15 ENCOUNTER — Ambulatory Visit: Payer: Self-pay | Admitting: Nurse Practitioner

## 2024-09-15 ENCOUNTER — Other Ambulatory Visit: Payer: Self-pay | Admitting: Nurse Practitioner

## 2024-09-15 VITALS — BP 132/74 | HR 85 | Temp 98.7°F | Ht 67.0 in | Wt 222.6 lb

## 2024-09-15 DIAGNOSIS — G479 Sleep disorder, unspecified: Secondary | ICD-10-CM

## 2024-09-15 DIAGNOSIS — H938X1 Other specified disorders of right ear: Secondary | ICD-10-CM | POA: Insufficient documentation

## 2024-09-15 DIAGNOSIS — E6609 Other obesity due to excess calories: Secondary | ICD-10-CM | POA: Diagnosis not present

## 2024-09-15 DIAGNOSIS — H6121 Impacted cerumen, right ear: Secondary | ICD-10-CM | POA: Diagnosis not present

## 2024-09-15 DIAGNOSIS — E66811 Obesity, class 1: Secondary | ICD-10-CM | POA: Diagnosis not present

## 2024-09-15 DIAGNOSIS — E782 Mixed hyperlipidemia: Secondary | ICD-10-CM | POA: Diagnosis not present

## 2024-09-15 DIAGNOSIS — I10 Essential (primary) hypertension: Secondary | ICD-10-CM | POA: Diagnosis not present

## 2024-09-15 DIAGNOSIS — Z6834 Body mass index (BMI) 34.0-34.9, adult: Secondary | ICD-10-CM | POA: Diagnosis not present

## 2024-09-15 DIAGNOSIS — E559 Vitamin D deficiency, unspecified: Secondary | ICD-10-CM

## 2024-09-15 NOTE — Patient Instructions (Signed)
 Contains text generated by [employer].

## 2024-09-15 NOTE — Progress Notes (Signed)
 LILLETTE Kristeen JINNY Gladis, CMA,acting as a neurosurgeon for Jose Ada, FNP.,have documented all relevant documentation on the behalf of Jose Ada, FNP,as directed by  Jose Ada, FNP while in the presence of Jose Ada, FNP.  Subjective:  Patient ID: [name] , male    DOB: [birth year] , 74 y.o.   MRN: [id number]  Chief Complaint  Patient presents with   Hypertension    Patient presents today for a bp and chol follow up, Patient reports compliance with medication. Patient denies any chest pain, SOB, or headaches.    Ear Fullness    Patient reports his right ear has been feeling full for the past 3 weeks. He used drops with no relief.     Right ear feels full for the last 3 weeks. No pain or fever. Denies nasal congestion    Discussed the use of AI scribe software for clinical note transcription with the patient, who gave verbal consent to proceed.  History of Present Illness [name] is a 74 year old male who presents with right ear fullness and sleep disturbances.  He has experienced fullness in his right ear for approximately three weeks, describing the sensation as 'stopped up', particularly noticeable when yawning. He reports no pain, fever, or congestion. He has used eardrops previously but is uncertain of their effectiveness.  He experiences sleep disturbances, often sleeping in a chair while watching TV and having difficulty maintaining sleep throughout the night. He frequently wakes up and has had this pattern since retiring. He does not use sleep aids or melatonin and tends to fall asleep after meals. His sleep schedule resembles that of someone working a night shift, with naps during the day and wakefulness at night.  He notes that his feet and ankles appear slightly puffy, but this improves with elevation at night. He notes that his feet and ankles appear slightly puffy, but does not report pain.  Past Medical History:  Diagnosis Date   Hypertension      Family  History  Problem Relation Age of Onset   Dementia Father    Colon cancer Neg Hx    Colon polyps Neg Hx    Esophageal cancer Neg Hx    Rectal cancer Neg Hx    Stomach cancer Neg Hx      Current Outpatient Medications:    amLODipine  (NORVASC ) 10 MG tablet, TAKE 1 TABLET BY MOUTH EVERY DAY AT BEDTIME, Disp: 30 tablet, Rfl: 11   atorvastatin  (LIPITOR) 40 MG tablet, TAKE 1 TABLET BY MOUTH AT BEDTIME, Disp: 30 tablet, Rfl: 11   benzonatate  (TESSALON ) 100 MG capsule, Take 1 capsule (100 mg total) by mouth 3 (three) times daily as needed for cough., Disp: 30 capsule, Rfl: 2   EPINEPHRINE  0.3 mg/0.3 mL IJ SOAJ injection, INJECT 0.3 MG INTRAMUSCULARLY AS NEEDED FOR ANAPHYLAXIS *REFILL REQUEST*, Disp: 2 each, Rfl: 10   valsartan -hydrochlorothiazide  (DIOVAN -HCT) 160-12.5 MG tablet, TAKE 1 TABLET BY MOUTH EVERY MORNING, Disp: 30 tablet, Rfl: 11   Vitamin D , Ergocalciferol , (DRISDOL ) 1.25 MG (50000 UNIT) CAPS capsule, TAKE 1 CAPSULE BY MOUTH EVERY 7 DAYS, Disp: 3 capsule, Rfl: 10   No Known Allergies   Review of Systems  Constitutional:  Negative for fatigue.  HENT:         Right ear fullness concerned about wax build up  Respiratory:  Negative for cough, shortness of breath and wheezing.   Cardiovascular:  Negative for chest pain and palpitations.  Neurological: Negative.  Negative for headaches.  Psychiatric/Behavioral: Negative.       Today's Vitals   09/15/24 1133  BP: 132/74  Pulse: 85  Temp: 98.7 F (37.1 C)  TempSrc: Oral  Weight: 222 lb 9.6 oz (101 kg)  Height: 5' 7 (1.702 m)  PainSc: 0-No pain   Body mass index is 34.86 kg/m.  Wt Readings from Last 3 Encounters:  09/15/24 222 lb 9.6 oz (101 kg)  07/05/24 222 lb (100.7 kg)  03/15/24 222 lb 3.2 oz (100.8 kg)     Objective:  Physical Exam Vitals and nursing note reviewed.  Constitutional:      General: He is not in acute distress.    Appearance: Normal appearance. He is obese.  HENT:     Right Ear: External ear  normal. There is impacted cerumen (firm cerumen present to canal).     Left Ear: Tympanic membrane, ear canal and external ear normal. There is no impacted cerumen.  Cardiovascular:     Rate and Rhythm: Normal rate and regular rhythm.     Pulses: Normal pulses.     Heart sounds: Normal heart sounds. No murmur heard. Pulmonary:     Effort: Pulmonary effort is normal. No respiratory distress.     Breath sounds: Normal breath sounds. No wheezing.  Skin:    General: Skin is warm and dry.     Capillary Refill: Capillary refill takes less than 2 seconds.     Coloration: Skin is not jaundiced.     Findings: No bruising.  Neurological:     General: No focal deficit present.     Mental Status: He is alert and oriented to person, place, and time.     Cranial Nerves: No cranial nerve deficit.     Motor: No weakness.  Psychiatric:        Mood and Affect: Mood normal.        Behavior: Behavior normal.        Thought Content: Thought content normal.        Judgment: Judgment normal.      Assessment And Plan:   Assessment & Plan Essential hypertension Blood pressure well-controlled at 132/74 mmHg. Mixed hyperlipidemia Cholesterol levels was normal at last check, continue statin. Continue low fat diet  Class 1 obesity due to excess calories with serious comorbidity and body mass index (BMI) of 34.0 to 34.9 in adult He is encouraged to strive for BMI less than 30 to decrease cardiac risk. Advised to aim for at least 150 minutes of exercise per week. Continue walking daily.   Impacted cerumen of right ear Deep cerumen impaction in the right ear causing fullness. Water lavage done  Sleep disturbance Difficulty sleeping, often in a chair, with concern for sleep deprivation. - Recommended over-the-counter magnesium to aid sleep.  Orders Placed This Encounter  Procedures   BMP8+eGFR   Lipid panel     Return for 6 month bp check.  Patient was given opportunity to ask questions. Patient  verbalized understanding of the plan and was able to repeat key elements of the plan. All questions were answered to their satisfaction.   LILLETTE Jose Ada, FNP, have reviewed all documentation for this visit. The documentation on 09/15/24 for the exam, diagnosis, procedures, and orders are all accurate and complete.    IF YOU HAVE BEEN REFERRED TO A SPECIALIST, IT MAY TAKE 1-2 WEEKS TO SCHEDULE/PROCESS THE REFERRAL. IF YOU HAVE NOT HEARD FROM US /SPECIALIST IN TWO WEEKS, PLEASE GIVE US  A CALL AT [phone] X [phone].

## 2024-09-16 LAB — LIPID PANEL
Chol/HDL Ratio: 5 ratio (ref 0.0–5.0)
Cholesterol, Total: 210 mg/dL — ABNORMAL HIGH (ref 100–199)
HDL: 42 mg/dL
LDL Chol Calc (NIH): 131 mg/dL — ABNORMAL HIGH (ref 0–99)
Triglycerides: 206 mg/dL — ABNORMAL HIGH (ref 0–149)
VLDL Cholesterol Cal: 37 mg/dL (ref 5–40)

## 2024-09-16 LAB — BMP8+EGFR
BUN/Creatinine Ratio: 16 (ref 10–24)
BUN: 17 mg/dL (ref 8–27)
CO2: 24 mmol/L (ref 20–29)
Calcium: 9.3 mg/dL (ref 8.6–10.2)
Chloride: 100 mmol/L (ref 96–106)
Creatinine, Ser: 1.04 mg/dL (ref 0.76–1.27)
Glucose: 82 mg/dL (ref 70–99)
Potassium: 4.4 mmol/L (ref 3.5–5.2)
Sodium: 138 mmol/L (ref 134–144)
eGFR: 75 mL/min/1.73

## 2024-09-26 ENCOUNTER — Ambulatory Visit: Payer: Self-pay | Admitting: Nurse Practitioner

## 2024-09-26 NOTE — Assessment & Plan Note (Addendum)
 He is encouraged to strive for BMI less than 30 to decrease cardiac risk. Advised to aim for at least 150 minutes of exercise per week. Continue walking daily.

## 2024-09-26 NOTE — Assessment & Plan Note (Addendum)
 Cholesterol levels was normal at last check, continue statin. Continue low fat diet

## 2024-09-26 NOTE — Assessment & Plan Note (Signed)
 Blood pressure well-controlled at 132/74 mmHg.

## 2024-09-26 NOTE — Assessment & Plan Note (Signed)
 Deep cerumen impaction in the right ear causing fullness. Water lavage done

## 2024-09-26 NOTE — Assessment & Plan Note (Signed)
 Difficulty sleeping, often in a chair, with concern for sleep deprivation. - Recommended over-the-counter magnesium to aid sleep.

## 2024-10-11 NOTE — Progress Notes (Signed)
 He needs to take his medication for cholesterol daily and cut back on fried and fatty foods.

## 2025-02-16 ENCOUNTER — Ambulatory Visit: Payer: Self-pay

## 2025-03-15 ENCOUNTER — Ambulatory Visit: Payer: Self-pay | Admitting: Nurse Practitioner
